# Patient Record
Sex: Female | Born: 1960 | Race: White | Smoking: Current every day smoker
Health system: Southern US, Community
[De-identification: ages and names within clinical notes are randomized; demographics above are authoritative.]

## PROBLEM LIST (undated history)

## (undated) DIAGNOSIS — I10 Essential (primary) hypertension: Secondary | ICD-10-CM

## (undated) DIAGNOSIS — E119 Type 2 diabetes mellitus without complications: Secondary | ICD-10-CM

## (undated) DIAGNOSIS — F32A Depression, unspecified: Secondary | ICD-10-CM

## (undated) HISTORY — PX: ABDOMINAL HYSTERECTOMY: SHX81

---

## 2020-05-24 ENCOUNTER — Other Ambulatory Visit: Payer: Self-pay | Admitting: Sports Medicine

## 2020-05-24 ENCOUNTER — Other Ambulatory Visit: Payer: Self-pay | Admitting: Internal Medicine

## 2020-05-24 DIAGNOSIS — M79671 Pain in right foot: Secondary | ICD-10-CM

## 2020-05-24 DIAGNOSIS — S86219A Strain of muscle(s) and tendon(s) of anterior muscle group at lower leg level, unspecified leg, initial encounter: Secondary | ICD-10-CM

## 2020-05-25 ENCOUNTER — Other Ambulatory Visit: Payer: Self-pay

## 2020-05-25 ENCOUNTER — Ambulatory Visit
Admission: RE | Admit: 2020-05-25 | Discharge: 2020-05-25 | Disposition: A | Discharge status: Home / Self Care | Payer: BC Managed Care – PPO | Source: Ambulatory Visit | Attending: Sports Medicine | Admitting: Sports Medicine

## 2020-05-25 DIAGNOSIS — M79671 Pain in right foot: Secondary | ICD-10-CM

## 2020-05-25 DIAGNOSIS — S86219A Strain of muscle(s) and tendon(s) of anterior muscle group at lower leg level, unspecified leg, initial encounter: Secondary | ICD-10-CM

## 2020-07-25 ENCOUNTER — Other Ambulatory Visit: Payer: Self-pay

## 2020-07-25 ENCOUNTER — Inpatient Hospital Stay (HOSPITAL_COMMUNITY): Payer: BC Managed Care – PPO

## 2020-07-25 ENCOUNTER — Inpatient Hospital Stay (HOSPITAL_COMMUNITY)
Admission: EM | Admit: 2020-07-25 | Discharge: 2020-07-29 | DRG: 522 | Disposition: A | Discharge status: Home Health | Payer: BC Managed Care – PPO | Attending: Internal Medicine | Admitting: Internal Medicine

## 2020-07-25 ENCOUNTER — Encounter (HOSPITAL_COMMUNITY)
Admission: EM | Disposition: A | Discharge status: Home Health | Payer: Self-pay | Source: Home / Self Care | Attending: Internal Medicine

## 2020-07-25 ENCOUNTER — Emergency Department (HOSPITAL_COMMUNITY): Payer: BC Managed Care – PPO

## 2020-07-25 ENCOUNTER — Inpatient Hospital Stay (HOSPITAL_COMMUNITY): Payer: BC Managed Care – PPO | Admitting: Anesthesiology

## 2020-07-25 ENCOUNTER — Encounter (HOSPITAL_COMMUNITY): Payer: Self-pay | Admitting: *Deleted

## 2020-07-25 DIAGNOSIS — D473 Essential (hemorrhagic) thrombocythemia: Secondary | ICD-10-CM

## 2020-07-25 DIAGNOSIS — D62 Acute posthemorrhagic anemia: Secondary | ICD-10-CM | POA: Diagnosis not present

## 2020-07-25 DIAGNOSIS — Z20822 Contact with and (suspected) exposure to covid-19: Secondary | ICD-10-CM | POA: Diagnosis present

## 2020-07-25 DIAGNOSIS — E1169 Type 2 diabetes mellitus with other specified complication: Secondary | ICD-10-CM | POA: Diagnosis present

## 2020-07-25 DIAGNOSIS — E785 Hyperlipidemia, unspecified: Secondary | ICD-10-CM | POA: Diagnosis present

## 2020-07-25 DIAGNOSIS — D75839 Thrombocytosis, unspecified: Secondary | ICD-10-CM | POA: Diagnosis present

## 2020-07-25 DIAGNOSIS — Y92009 Unspecified place in unspecified non-institutional (private) residence as the place of occurrence of the external cause: Secondary | ICD-10-CM

## 2020-07-25 DIAGNOSIS — F329 Major depressive disorder, single episode, unspecified: Secondary | ICD-10-CM | POA: Diagnosis present

## 2020-07-25 DIAGNOSIS — S72051A Unspecified fracture of head of right femur, initial encounter for closed fracture: Secondary | ICD-10-CM | POA: Diagnosis not present

## 2020-07-25 DIAGNOSIS — R7989 Other specified abnormal findings of blood chemistry: Secondary | ICD-10-CM | POA: Diagnosis present

## 2020-07-25 DIAGNOSIS — E871 Hypo-osmolality and hyponatremia: Secondary | ICD-10-CM | POA: Diagnosis present

## 2020-07-25 DIAGNOSIS — Z96641 Presence of right artificial hip joint: Secondary | ICD-10-CM

## 2020-07-25 DIAGNOSIS — D561 Beta thalassemia: Secondary | ICD-10-CM | POA: Diagnosis present

## 2020-07-25 DIAGNOSIS — S72091A Other fracture of head and neck of right femur, initial encounter for closed fracture: Secondary | ICD-10-CM | POA: Diagnosis present

## 2020-07-25 DIAGNOSIS — D638 Anemia in other chronic diseases classified elsewhere: Secondary | ICD-10-CM | POA: Diagnosis present

## 2020-07-25 DIAGNOSIS — Z90710 Acquired absence of both cervix and uterus: Secondary | ICD-10-CM | POA: Diagnosis not present

## 2020-07-25 DIAGNOSIS — S7290XA Unspecified fracture of unspecified femur, initial encounter for closed fracture: Secondary | ICD-10-CM | POA: Diagnosis present

## 2020-07-25 DIAGNOSIS — N179 Acute kidney failure, unspecified: Secondary | ICD-10-CM | POA: Diagnosis present

## 2020-07-25 DIAGNOSIS — Z6827 Body mass index (BMI) 27.0-27.9, adult: Secondary | ICD-10-CM

## 2020-07-25 DIAGNOSIS — Z888 Allergy status to other drugs, medicaments and biological substances status: Secondary | ICD-10-CM | POA: Diagnosis not present

## 2020-07-25 DIAGNOSIS — E669 Obesity, unspecified: Secondary | ICD-10-CM | POA: Diagnosis present

## 2020-07-25 DIAGNOSIS — S72001A Fracture of unspecified part of neck of right femur, initial encounter for closed fracture: Secondary | ICD-10-CM | POA: Diagnosis present

## 2020-07-25 DIAGNOSIS — D72829 Elevated white blood cell count, unspecified: Secondary | ICD-10-CM | POA: Diagnosis present

## 2020-07-25 DIAGNOSIS — F32A Depression, unspecified: Secondary | ICD-10-CM | POA: Diagnosis present

## 2020-07-25 DIAGNOSIS — R451 Restlessness and agitation: Secondary | ICD-10-CM | POA: Diagnosis not present

## 2020-07-25 DIAGNOSIS — Z79899 Other long term (current) drug therapy: Secondary | ICD-10-CM

## 2020-07-25 DIAGNOSIS — Z7984 Long term (current) use of oral hypoglycemic drugs: Secondary | ICD-10-CM

## 2020-07-25 DIAGNOSIS — Z419 Encounter for procedure for purposes other than remedying health state, unspecified: Secondary | ICD-10-CM

## 2020-07-25 DIAGNOSIS — I10 Essential (primary) hypertension: Secondary | ICD-10-CM | POA: Diagnosis present

## 2020-07-25 DIAGNOSIS — S728X1A Other fracture of right femur, initial encounter for closed fracture: Secondary | ICD-10-CM | POA: Diagnosis not present

## 2020-07-25 DIAGNOSIS — Z880 Allergy status to penicillin: Secondary | ICD-10-CM | POA: Diagnosis not present

## 2020-07-25 DIAGNOSIS — F17210 Nicotine dependence, cigarettes, uncomplicated: Secondary | ICD-10-CM | POA: Diagnosis present

## 2020-07-25 DIAGNOSIS — K219 Gastro-esophageal reflux disease without esophagitis: Secondary | ICD-10-CM | POA: Diagnosis present

## 2020-07-25 DIAGNOSIS — W010XXA Fall on same level from slipping, tripping and stumbling without subsequent striking against object, initial encounter: Secondary | ICD-10-CM | POA: Diagnosis present

## 2020-07-25 DIAGNOSIS — W19XXXA Unspecified fall, initial encounter: Secondary | ICD-10-CM | POA: Diagnosis present

## 2020-07-25 DIAGNOSIS — D509 Iron deficiency anemia, unspecified: Secondary | ICD-10-CM | POA: Diagnosis present

## 2020-07-25 HISTORY — PX: TOTAL HIP ARTHROPLASTY: SHX124

## 2020-07-25 HISTORY — DX: Depression, unspecified: F32.A

## 2020-07-25 HISTORY — DX: Type 2 diabetes mellitus without complications: E11.9

## 2020-07-25 HISTORY — DX: Essential (primary) hypertension: I10

## 2020-07-25 LAB — CBG MONITORING, ED
Glucose-Capillary: 145 mg/dL — ABNORMAL HIGH (ref 70–99)
Glucose-Capillary: 131 mg/dL — ABNORMAL HIGH (ref 70–99)

## 2020-07-25 LAB — URINALYSIS, ROUTINE W REFLEX MICROSCOPIC
Bilirubin Urine: NEGATIVE
Glucose, UA: NEGATIVE mg/dL
Hgb urine dipstick: NEGATIVE
Ketones, ur: NEGATIVE mg/dL
Leukocytes,Ua: NEGATIVE
Nitrite: NEGATIVE
Protein, ur: NEGATIVE mg/dL
Specific Gravity, Urine: 1.023 (ref 1.005–1.030)
pH: 5 (ref 5.0–8.0)

## 2020-07-25 LAB — ABO/RH: ABO/RH(D): O POS

## 2020-07-25 LAB — POCT I-STAT, CHEM 8
BUN: 21 mg/dL — ABNORMAL HIGH (ref 6–20)
Calcium, Ion: 1.21 mmol/L (ref 1.15–1.40)
Chloride: 98 mmol/L (ref 98–111)
Creatinine, Ser: 1 mg/dL (ref 0.44–1.00)
Glucose, Bld: 130 mg/dL — ABNORMAL HIGH (ref 70–99)
HCT: 23 % — ABNORMAL LOW (ref 36.0–46.0)
Hemoglobin: 7.8 g/dL — ABNORMAL LOW (ref 12.0–15.0)
Potassium: 4.3 mmol/L (ref 3.5–5.1)
Sodium: 135 mmol/L (ref 135–145)
TCO2: 22 mmol/L (ref 22–32)

## 2020-07-25 LAB — CBC
HCT: 34.4 % — ABNORMAL LOW (ref 36.0–46.0)
Hemoglobin: 9.6 g/dL — ABNORMAL LOW (ref 12.0–15.0)
MCH: 16.8 pg — ABNORMAL LOW (ref 26.0–34.0)
MCHC: 27.9 g/dL — ABNORMAL LOW (ref 30.0–36.0)
MCV: 60.1 fL — ABNORMAL LOW (ref 80.0–100.0)
Platelets: 401 10*3/uL — ABNORMAL HIGH (ref 150–400)
RBC: 5.72 MIL/uL — ABNORMAL HIGH (ref 3.87–5.11)
RDW: 18.7 % — ABNORMAL HIGH (ref 11.5–15.5)
WBC: 14.9 10*3/uL — ABNORMAL HIGH (ref 4.0–10.5)
nRBC: 0 % (ref 0.0–0.2)

## 2020-07-25 LAB — BASIC METABOLIC PANEL
Anion gap: 15 (ref 5–15)
BUN: 20 mg/dL (ref 6–20)
CO2: 21 mmol/L — ABNORMAL LOW (ref 22–32)
Calcium: 10.1 mg/dL (ref 8.9–10.3)
Chloride: 95 mmol/L — ABNORMAL LOW (ref 98–111)
Creatinine, Ser: 1.25 mg/dL — ABNORMAL HIGH (ref 0.44–1.00)
GFR calc Af Amer: 55 mL/min — ABNORMAL LOW (ref >60)
GFR calc non Af Amer: 47 mL/min — ABNORMAL LOW (ref >60)
Glucose, Bld: 162 mg/dL — ABNORMAL HIGH (ref 70–99)
Potassium: 3.9 mmol/L (ref 3.5–5.1)
Sodium: 131 mmol/L — ABNORMAL LOW (ref 135–145)

## 2020-07-25 LAB — GLUCOSE, CAPILLARY
Glucose-Capillary: 131 mg/dL — ABNORMAL HIGH (ref 70–99)
Glucose-Capillary: 201 mg/dL — ABNORMAL HIGH (ref 70–99)

## 2020-07-25 LAB — HEMOGLOBIN A1C
Hgb A1c MFr Bld: 7 % — ABNORMAL HIGH (ref 4.8–5.6)
Mean Plasma Glucose: 154.2 mg/dL

## 2020-07-25 LAB — HIV ANTIBODY (ROUTINE TESTING W REFLEX): HIV Screen 4th Generation wRfx: NONREACTIVE

## 2020-07-25 LAB — CK: Total CK: 132 U/L (ref 38–234)

## 2020-07-25 LAB — SARS CORONAVIRUS 2 BY RT PCR (HOSPITAL ORDER, PERFORMED IN ~~LOC~~ HOSPITAL LAB): SARS Coronavirus 2: NEGATIVE

## 2020-07-25 SURGERY — ARTHROPLASTY, HIP, TOTAL,POSTERIOR APPROACH
Anesthesia: General | Site: Hip | Laterality: Right

## 2020-07-25 MED ORDER — ROCURONIUM BROMIDE 10 MG/ML (PF) SYRINGE
PREFILLED_SYRINGE | INTRAVENOUS | Status: DC | PRN
  Administered 2020-07-25: 50 mg via INTRAVENOUS

## 2020-07-25 MED ORDER — FENTANYL CITRATE (PF) 100 MCG/2ML IJ SOLN
INTRAMUSCULAR | Status: AC
  Filled 2020-07-25: qty 2

## 2020-07-25 MED ORDER — SENNA 8.6 MG PO TABS
1.0000 | ORAL_TABLET | Freq: Every evening | ORAL | Status: DC
  Administered 2020-07-26 – 2020-07-29 (×4): 8.6 mg via ORAL
  Filled 2020-07-25 (×4): qty 1

## 2020-07-25 MED ORDER — SODIUM CHLORIDE 0.9% IV SOLUTION
Freq: Once | INTRAVENOUS | Status: AC
  Administered 2020-07-25: 200 mL/h via INTRAVENOUS

## 2020-07-25 MED ORDER — ACETAMINOPHEN 325 MG PO TABS
325.0000 mg | ORAL_TABLET | Freq: Four times a day (QID) | ORAL | Status: DC | PRN
  Administered 2020-07-26 – 2020-07-27 (×2): 325 mg via ORAL
  Administered 2020-07-28 (×2): 650 mg via ORAL
  Filled 2020-07-25: qty 1
  Filled 2020-07-25: qty 2
  Filled 2020-07-25: qty 1
  Filled 2020-07-25: qty 2

## 2020-07-25 MED ORDER — PROPOFOL 10 MG/ML IV BOLUS
INTRAVENOUS | Status: DC | PRN
  Administered 2020-07-25: 120 mg via INTRAVENOUS

## 2020-07-25 MED ORDER — TRANEXAMIC ACID-NACL 1000-0.7 MG/100ML-% IV SOLN
INTRAVENOUS | Status: AC | PRN
  Administered 2020-07-25: 1000 mg via INTRAVENOUS

## 2020-07-25 MED ORDER — SODIUM CHLORIDE 0.9 % IV SOLN: Freq: Once | INTRAVENOUS | Status: AC

## 2020-07-25 MED ORDER — ROSUVASTATIN CALCIUM 5 MG PO TABS
10.0000 mg | ORAL_TABLET | Freq: Every day | ORAL | Status: DC
  Administered 2020-07-25 – 2020-07-29 (×5): 10 mg via ORAL
  Filled 2020-07-25 (×5): qty 2

## 2020-07-25 MED ORDER — CHLORHEXIDINE GLUCONATE 0.12 % MT SOLN
OROMUCOSAL | Status: AC
  Filled 2020-07-25: qty 15

## 2020-07-25 MED ORDER — POVIDONE-IODINE 10 % EX SWAB: 2.0000 "application " | Freq: Once | CUTANEOUS | Status: DC

## 2020-07-25 MED ORDER — PANTOPRAZOLE SODIUM 40 MG PO TBEC
40.0000 mg | DELAYED_RELEASE_TABLET | Freq: Every day | ORAL | Status: DC
  Administered 2020-07-25: 40 mg via ORAL
  Filled 2020-07-25: qty 1

## 2020-07-25 MED ORDER — MEPERIDINE HCL 25 MG/ML IJ SOLN: 6.2500 mg | INTRAMUSCULAR | Status: DC | PRN

## 2020-07-25 MED ORDER — OXYCODONE HCL 5 MG PO TABS: 5.0000 mg | ORAL_TABLET | Freq: Once | ORAL | Status: DC | PRN

## 2020-07-25 MED ORDER — CHLORHEXIDINE GLUCONATE 4 % EX LIQD: 60.0000 mL | Freq: Once | CUTANEOUS | Status: DC

## 2020-07-25 MED ORDER — BISACODYL 5 MG PO TBEC: 5.0000 mg | DELAYED_RELEASE_TABLET | Freq: Every day | ORAL | Status: DC | PRN

## 2020-07-25 MED ORDER — OXYCODONE HCL 5 MG/5ML PO SOLN: 5.0000 mg | Freq: Once | ORAL | Status: DC | PRN

## 2020-07-25 MED ORDER — BUPIVACAINE-EPINEPHRINE 0.5% -1:200000 IJ SOLN
INTRAMUSCULAR | Status: DC | PRN
  Administered 2020-07-25: 30 mL

## 2020-07-25 MED ORDER — ALUM & MAG HYDROXIDE-SIMETH 200-200-20 MG/5ML PO SUSP: 30.0000 mL | ORAL | Status: DC | PRN

## 2020-07-25 MED ORDER — CEFAZOLIN SODIUM-DEXTROSE 2-4 GM/100ML-% IV SOLN: 2.0000 g | INTRAVENOUS | Status: DC

## 2020-07-25 MED ORDER — TRANEXAMIC ACID-NACL 1000-0.7 MG/100ML-% IV SOLN
INTRAVENOUS | Status: AC
  Filled 2020-07-25: qty 100

## 2020-07-25 MED ORDER — DEXAMETHASONE SODIUM PHOSPHATE 10 MG/ML IJ SOLN
INTRAMUSCULAR | Status: AC
  Filled 2020-07-25: qty 1

## 2020-07-25 MED ORDER — FERROUS SULFATE 325 (65 FE) MG PO TABS
325.0000 mg | ORAL_TABLET | Freq: Two times a day (BID) | ORAL | Status: DC
  Administered 2020-07-26 – 2020-07-29 (×8): 325 mg via ORAL
  Filled 2020-07-25 (×8): qty 1

## 2020-07-25 MED ORDER — ALBUMIN HUMAN 5 % IV SOLN: INTRAVENOUS | Status: DC | PRN

## 2020-07-25 MED ORDER — CLINDAMYCIN PHOSPHATE 900 MG/50ML IV SOLN
INTRAVENOUS | Status: AC
  Filled 2020-07-25: qty 50

## 2020-07-25 MED ORDER — BUPIVACAINE HCL (PF) 0.5 % IJ SOLN
INTRAMUSCULAR | Status: AC
  Filled 2020-07-25: qty 30

## 2020-07-25 MED ORDER — HYDROMORPHONE HCL 1 MG/ML IJ SOLN
1.0000 mg | Freq: Once | INTRAMUSCULAR | Status: AC
  Administered 2020-07-25: 1 mg via INTRAVENOUS
  Filled 2020-07-25: qty 1

## 2020-07-25 MED ORDER — BUPIVACAINE-EPINEPHRINE 0.5% -1:200000 IJ SOLN
INTRAMUSCULAR | Status: AC
  Filled 2020-07-25: qty 1

## 2020-07-25 MED ORDER — POLYETHYLENE GLYCOL 3350 17 G PO PACK: 17.0000 g | PACK | Freq: Every day | ORAL | Status: DC | PRN

## 2020-07-25 MED ORDER — FENTANYL CITRATE (PF) 250 MCG/5ML IJ SOLN
INTRAMUSCULAR | Status: AC
  Filled 2020-07-25: qty 5

## 2020-07-25 MED ORDER — OXYCODONE HCL 5 MG PO TABS
5.0000 mg | ORAL_TABLET | ORAL | Status: DC | PRN
  Administered 2020-07-25: 5 mg via ORAL
  Filled 2020-07-25 (×2): qty 1

## 2020-07-25 MED ORDER — ONDANSETRON HCL 4 MG/2ML IJ SOLN
INTRAMUSCULAR | Status: DC | PRN
  Administered 2020-07-25: 4 mg via INTRAVENOUS

## 2020-07-25 MED ORDER — DEXTROSE-NACL 5-0.45 % IV SOLN: INTRAVENOUS | Status: DC

## 2020-07-25 MED ORDER — DOCUSATE SODIUM 100 MG PO CAPS
100.0000 mg | ORAL_CAPSULE | Freq: Two times a day (BID) | ORAL | Status: DC
  Administered 2020-07-25 – 2020-07-29 (×8): 100 mg via ORAL
  Filled 2020-07-25 (×8): qty 1

## 2020-07-25 MED ORDER — INSULIN ASPART 100 UNIT/ML ~~LOC~~ SOLN: 0.0000 [IU] | Freq: Every day | SUBCUTANEOUS | Status: DC

## 2020-07-25 MED ORDER — ENOXAPARIN SODIUM 40 MG/0.4ML ~~LOC~~ SOLN
40.0000 mg | SUBCUTANEOUS | Status: DC
  Administered 2020-07-25: 40 mg via SUBCUTANEOUS
  Filled 2020-07-25 (×2): qty 0.4

## 2020-07-25 MED ORDER — MIDAZOLAM HCL 2 MG/2ML IJ SOLN
INTRAMUSCULAR | Status: AC
  Filled 2020-07-25: qty 2

## 2020-07-25 MED ORDER — METOCLOPRAMIDE HCL 5 MG/ML IJ SOLN: 5.0000 mg | Freq: Three times a day (TID) | INTRAMUSCULAR | Status: DC | PRN

## 2020-07-25 MED ORDER — INSULIN ASPART 100 UNIT/ML ~~LOC~~ SOLN
0.0000 [IU] | Freq: Three times a day (TID) | SUBCUTANEOUS | Status: DC
  Administered 2020-07-26: 2 [IU] via SUBCUTANEOUS
  Administered 2020-07-26: 1 [IU] via SUBCUTANEOUS
  Administered 2020-07-26: 2 [IU] via SUBCUTANEOUS
  Administered 2020-07-27: 1 [IU] via SUBCUTANEOUS
  Administered 2020-07-27 (×2): 2 [IU] via SUBCUTANEOUS
  Administered 2020-07-28: 1 [IU] via SUBCUTANEOUS

## 2020-07-25 MED ORDER — TRANEXAMIC ACID-NACL 1000-0.7 MG/100ML-% IV SOLN
1000.0000 mg | INTRAVENOUS | Status: AC
  Administered 2020-07-25: 1000 mg via INTRAVENOUS

## 2020-07-25 MED ORDER — HYDROMORPHONE HCL 1 MG/ML IJ SOLN
0.2500 mg | INTRAMUSCULAR | Status: DC | PRN
  Administered 2020-07-25 (×2): 0.5 mg via INTRAVENOUS

## 2020-07-25 MED ORDER — OXYCODONE HCL 5 MG PO TABS
10.0000 mg | ORAL_TABLET | ORAL | Status: DC | PRN
  Administered 2020-07-26 – 2020-07-27 (×3): 15 mg via ORAL
  Administered 2020-07-27 – 2020-07-28 (×2): 10 mg via ORAL
  Filled 2020-07-25 (×3): qty 3

## 2020-07-25 MED ORDER — INSULIN ASPART 100 UNIT/ML ~~LOC~~ SOLN
6.0000 [IU] | Freq: Once | SUBCUTANEOUS | Status: AC
  Administered 2020-07-25: 6 [IU] via SUBCUTANEOUS

## 2020-07-25 MED ORDER — METHOCARBAMOL 1000 MG/10ML IJ SOLN
500.0000 mg | Freq: Four times a day (QID) | INTRAVENOUS | Status: DC | PRN
  Filled 2020-07-25: qty 5

## 2020-07-25 MED ORDER — ZOLPIDEM TARTRATE 5 MG PO TABS
5.0000 mg | ORAL_TABLET | Freq: Every day | ORAL | Status: DC
  Administered 2020-07-25 – 2020-07-28 (×4): 5 mg via ORAL
  Filled 2020-07-25 (×4): qty 1

## 2020-07-25 MED ORDER — ONDANSETRON HCL 4 MG/2ML IJ SOLN
4.0000 mg | Freq: Once | INTRAMUSCULAR | Status: AC
  Administered 2020-07-25: 4 mg via INTRAVENOUS
  Filled 2020-07-25: qty 2

## 2020-07-25 MED ORDER — MIDAZOLAM HCL 5 MG/5ML IJ SOLN
INTRAMUSCULAR | Status: DC | PRN
  Administered 2020-07-25: 2 mg via INTRAVENOUS

## 2020-07-25 MED ORDER — BUPROPION HCL ER (XL) 150 MG PO TB24
150.0000 mg | ORAL_TABLET | Freq: Every morning | ORAL | Status: DC
  Administered 2020-07-25 – 2020-07-29 (×5): 150 mg via ORAL
  Filled 2020-07-25 (×5): qty 1

## 2020-07-25 MED ORDER — LIDOCAINE 2% (20 MG/ML) 5 ML SYRINGE
INTRAMUSCULAR | Status: AC
  Filled 2020-07-25: qty 5

## 2020-07-25 MED ORDER — CLINDAMYCIN PHOSPHATE 900 MG/50ML IV SOLN: 900.0000 mg | Freq: Once | INTRAVENOUS | Status: DC

## 2020-07-25 MED ORDER — ONDANSETRON HCL 4 MG PO TABS: 4.0000 mg | ORAL_TABLET | Freq: Four times a day (QID) | ORAL | Status: DC | PRN

## 2020-07-25 MED ORDER — LACTATED RINGERS IV SOLN: INTRAVENOUS | Status: DC | PRN

## 2020-07-25 MED ORDER — VANCOMYCIN HCL IN DEXTROSE 1-5 GM/200ML-% IV SOLN
1000.0000 mg | Freq: Once | INTRAVENOUS | Status: AC
  Administered 2020-07-25: 1000 mg via INTRAVENOUS
  Filled 2020-07-25: qty 200

## 2020-07-25 MED ORDER — BUPIVACAINE LIPOSOME 1.3 % IJ SUSP
20.0000 mL | Freq: Once | INTRAMUSCULAR | Status: AC
  Administered 2020-07-25: 20 mL
  Filled 2020-07-25: qty 20

## 2020-07-25 MED ORDER — TRANEXAMIC ACID-NACL 1000-0.7 MG/100ML-% IV SOLN
1000.0000 mg | Freq: Once | INTRAVENOUS | Status: AC
  Administered 2020-07-25: 1000 mg via INTRAVENOUS
  Filled 2020-07-25: qty 100

## 2020-07-25 MED ORDER — PROMETHAZINE HCL 25 MG/ML IJ SOLN: 6.2500 mg | INTRAMUSCULAR | Status: DC | PRN

## 2020-07-25 MED ORDER — HYDROMORPHONE HCL 1 MG/ML IJ SOLN
INTRAMUSCULAR | Status: AC
  Administered 2020-07-27: 1 mg via INTRAVENOUS
  Filled 2020-07-25: qty 1

## 2020-07-25 MED ORDER — CHLORHEXIDINE GLUCONATE 0.12 % MT SOLN: 15.0000 mL | Freq: Once | OROMUCOSAL | Status: DC

## 2020-07-25 MED ORDER — SUGAMMADEX SODIUM 200 MG/2ML IV SOLN
INTRAVENOUS | Status: DC | PRN
  Administered 2020-07-25: 200 mg via INTRAVENOUS

## 2020-07-25 MED ORDER — DULOXETINE HCL 60 MG PO CPEP
60.0000 mg | ORAL_CAPSULE | Freq: Every day | ORAL | Status: DC
  Administered 2020-07-25 – 2020-07-29 (×5): 60 mg via ORAL
  Filled 2020-07-25 (×5): qty 1

## 2020-07-25 MED ORDER — CEFAZOLIN SODIUM-DEXTROSE 2-4 GM/100ML-% IV SOLN
INTRAVENOUS | Status: AC
  Filled 2020-07-25: qty 100

## 2020-07-25 MED ORDER — METOCLOPRAMIDE HCL 5 MG PO TABS: 5.0000 mg | ORAL_TABLET | Freq: Three times a day (TID) | ORAL | Status: DC | PRN

## 2020-07-25 MED ORDER — LIDOCAINE 2% (20 MG/ML) 5 ML SYRINGE
INTRAMUSCULAR | Status: DC | PRN
  Administered 2020-07-25: 40 mg via INTRAVENOUS

## 2020-07-25 MED ORDER — NICOTINE 14 MG/24HR TD PT24
14.0000 mg | MEDICATED_PATCH | Freq: Every day | TRANSDERMAL | Status: DC
  Administered 2020-07-25 – 2020-07-29 (×4): 14 mg via TRANSDERMAL
  Filled 2020-07-25 (×5): qty 1

## 2020-07-25 MED ORDER — HYDROMORPHONE HCL 1 MG/ML IJ SOLN
0.5000 mg | INTRAMUSCULAR | Status: DC | PRN
  Administered 2020-07-25 (×3): 0.5 mg via INTRAVENOUS
  Filled 2020-07-25 (×3): qty 1

## 2020-07-25 MED ORDER — METHOCARBAMOL 500 MG PO TABS
500.0000 mg | ORAL_TABLET | Freq: Four times a day (QID) | ORAL | Status: DC | PRN
  Administered 2020-07-26 – 2020-07-29 (×9): 500 mg via ORAL
  Filled 2020-07-25 (×9): qty 1

## 2020-07-25 MED ORDER — ROCURONIUM BROMIDE 10 MG/ML (PF) SYRINGE
PREFILLED_SYRINGE | INTRAVENOUS | Status: AC
  Filled 2020-07-25: qty 10

## 2020-07-25 MED ORDER — OXYCODONE HCL 5 MG PO TABS: 5.0000 mg | ORAL_TABLET | ORAL | Status: DC | PRN

## 2020-07-25 MED ORDER — DEXAMETHASONE SODIUM PHOSPHATE 10 MG/ML IJ SOLN
INTRAMUSCULAR | Status: DC | PRN
  Administered 2020-07-25: 10 mg via INTRAVENOUS

## 2020-07-25 MED ORDER — MIDAZOLAM HCL 2 MG/2ML IJ SOLN: 0.5000 mg | Freq: Once | INTRAMUSCULAR | Status: DC | PRN

## 2020-07-25 MED ORDER — 0.9 % SODIUM CHLORIDE (POUR BTL) OPTIME
TOPICAL | Status: DC | PRN
  Administered 2020-07-25: 1000 mL

## 2020-07-25 MED ORDER — HYDROMORPHONE HCL 1 MG/ML IJ SOLN
0.5000 mg | INTRAMUSCULAR | Status: DC | PRN
  Administered 2020-07-25 – 2020-07-27 (×6): 1 mg via INTRAVENOUS
  Filled 2020-07-25 (×6): qty 1

## 2020-07-25 MED ORDER — PANTOPRAZOLE SODIUM 40 MG PO TBEC
40.0000 mg | DELAYED_RELEASE_TABLET | Freq: Every day | ORAL | Status: DC
  Administered 2020-07-26 – 2020-07-29 (×4): 40 mg via ORAL
  Filled 2020-07-25 (×4): qty 1

## 2020-07-25 MED ORDER — HYDROMORPHONE HCL 1 MG/ML IJ SOLN
1.0000 mg | INTRAMUSCULAR | Status: DC | PRN
  Filled 2020-07-25: qty 1

## 2020-07-25 MED ORDER — FENTANYL CITRATE (PF) 100 MCG/2ML IJ SOLN
INTRAMUSCULAR | Status: DC | PRN
Start: 2020-07-25 — End: 2020-07-25
  Administered 2020-07-25 (×2): 50 ug via INTRAVENOUS
  Administered 2020-07-25: 150 ug via INTRAVENOUS

## 2020-07-25 MED ORDER — MAGNESIUM CITRATE PO SOLN: 1.0000 | Freq: Once | ORAL | Status: DC | PRN

## 2020-07-25 MED ORDER — PHENYLEPHRINE HCL (PRESSORS) 10 MG/ML IV SOLN
INTRAVENOUS | Status: DC | PRN
  Administered 2020-07-25 (×2): 80 ug via INTRAVENOUS
  Administered 2020-07-25 (×4): 120 ug via INTRAVENOUS
  Administered 2020-07-25 (×2): 80 ug via INTRAVENOUS
  Administered 2020-07-25 (×2): 120 ug via INTRAVENOUS
  Administered 2020-07-25: 40 ug via INTRAVENOUS

## 2020-07-25 MED ORDER — PROPOFOL 10 MG/ML IV BOLUS
INTRAVENOUS | Status: AC
  Filled 2020-07-25: qty 20

## 2020-07-25 MED ORDER — HYDRALAZINE HCL 20 MG/ML IJ SOLN: 10.0000 mg | INTRAMUSCULAR | Status: DC | PRN

## 2020-07-25 MED ORDER — INSULIN ASPART 100 UNIT/ML ~~LOC~~ SOLN
SUBCUTANEOUS | Status: AC
  Filled 2020-07-25: qty 1

## 2020-07-25 MED ORDER — ONDANSETRON HCL 4 MG/2ML IJ SOLN: 4.0000 mg | Freq: Four times a day (QID) | INTRAMUSCULAR | Status: DC | PRN

## 2020-07-25 MED ORDER — OXYCODONE HCL 5 MG PO TABS
5.0000 mg | ORAL_TABLET | ORAL | Status: DC | PRN
  Administered 2020-07-26 (×2): 10 mg via ORAL
  Administered 2020-07-26: 5 mg via ORAL
  Administered 2020-07-27 – 2020-07-29 (×2): 10 mg via ORAL
  Filled 2020-07-25 (×7): qty 2

## 2020-07-25 MED ORDER — ONDANSETRON HCL 4 MG/2ML IJ SOLN
INTRAMUSCULAR | Status: AC
  Filled 2020-07-25: qty 2

## 2020-07-25 MED ORDER — PHENYLEPHRINE 40 MCG/ML (10ML) SYRINGE FOR IV PUSH (FOR BLOOD PRESSURE SUPPORT)
PREFILLED_SYRINGE | INTRAVENOUS | Status: AC
  Filled 2020-07-25: qty 30

## 2020-07-25 SURGICAL SUPPLY — 67 items
BENZOIN TINCTURE PRP APPL 2/3 (GAUZE/BANDAGES/DRESSINGS) ×3 IMPLANT
BLADE SAW SAG 73X25 THK (BLADE) ×2
BLADE SAW SGTL 73X25 THK (BLADE) ×1 IMPLANT
BRUSH FEMORAL CANAL (MISCELLANEOUS) IMPLANT
CLOSURE WOUND 1/2 X4 (GAUZE/BANDAGES/DRESSINGS) ×1
COVER BACK TABLE 24X17X13 BIG (DRAPES) IMPLANT
COVER SURGICAL LIGHT HANDLE (MISCELLANEOUS) ×6 IMPLANT
COVER WAND RF STERILE (DRAPES) IMPLANT
DRAPE HALF SHEET 40X57 (DRAPES) ×3 IMPLANT
DRAPE IMP U-DRAPE 54X76 (DRAPES) ×3 IMPLANT
DRAPE ORTHO SPLIT 77X108 STRL (DRAPES)
DRAPE SURG ORHT 6 SPLT 77X108 (DRAPES) IMPLANT
DRAPE U-SHAPE 47X51 STRL (DRAPES) ×3 IMPLANT
DRSG AQUACEL AG ADV 3.5X 4 (GAUZE/BANDAGES/DRESSINGS) ×3 IMPLANT
DRSG AQUACEL AG ADV 3.5X 6 (GAUZE/BANDAGES/DRESSINGS) ×3 IMPLANT
DRSG MEPILEX BORDER 4X12 (GAUZE/BANDAGES/DRESSINGS) IMPLANT
DURAPREP 26ML APPLICATOR (WOUND CARE) ×3 IMPLANT
ELECT BLADE 6.5 EXT (BLADE) IMPLANT
ELECT CAUTERY BLADE 6.4 (BLADE) ×3 IMPLANT
ELECT REM PT RETURN 9FT ADLT (ELECTROSURGICAL) ×3
ELECTRODE REM PT RTRN 9FT ADLT (ELECTROSURGICAL) ×1 IMPLANT
ELIMINATOR HOLE APEX DEPUY (Hips) ×3 IMPLANT
EVACUATOR 1/8 PVC DRAIN (DRAIN) IMPLANT
GLOVE BIOGEL PI IND STRL 8 (GLOVE) ×2 IMPLANT
GLOVE BIOGEL PI INDICATOR 8 (GLOVE) ×4
GLOVE ECLIPSE 7.5 STRL STRAW (GLOVE) ×6 IMPLANT
GOWN STRL REUS W/ TWL LRG LVL3 (GOWN DISPOSABLE) ×1 IMPLANT
GOWN STRL REUS W/ TWL XL LVL3 (GOWN DISPOSABLE) ×2 IMPLANT
GOWN STRL REUS W/TWL LRG LVL3 (GOWN DISPOSABLE) ×2
GOWN STRL REUS W/TWL XL LVL3 (GOWN DISPOSABLE) ×4
HEAD FEMORAL 32 CERAMIC (Hips) ×3 IMPLANT
HOOD PEEL AWAY FACE SHEILD DIS (HOOD) ×9 IMPLANT
IMMOBILIZER KNEE 20 (SOFTGOODS)
IMMOBILIZER KNEE 20 THIGH 36 (SOFTGOODS) IMPLANT
IMMOBILIZER KNEE 22 UNIV (SOFTGOODS) IMPLANT
IMMOBILIZER KNEE 24 THIGH 36 (MISCELLANEOUS) IMPLANT
IMMOBILIZER KNEE 24 UNIV (MISCELLANEOUS)
KIT BASIN OR (CUSTOM PROCEDURE TRAY) ×3 IMPLANT
KIT TURNOVER KIT B (KITS) ×3 IMPLANT
LINER ACETABULAR 32X50 (Liner) ×3 IMPLANT
MANIFOLD NEPTUNE II (INSTRUMENTS) ×3 IMPLANT
NEEDLE 1/2 CIR MAYO (NEEDLE) ×3 IMPLANT
NEEDLE 22X1 1/2 (OR ONLY) (NEEDLE) ×3 IMPLANT
NS IRRIG 1000ML POUR BTL (IV SOLUTION) ×3 IMPLANT
PACK TOTAL JOINT (CUSTOM PROCEDURE TRAY) ×3 IMPLANT
PACK UNIVERSAL I (CUSTOM PROCEDURE TRAY) ×3 IMPLANT
PAD ARMBOARD 7.5X6 YLW CONV (MISCELLANEOUS) ×6 IMPLANT
PASSER SUT SWANSON 36MM LOOP (INSTRUMENTS) IMPLANT
PIN SECT CUP 50MM (Hips) ×3 IMPLANT
PRESSURIZER FEMORAL UNIV (MISCELLANEOUS) IMPLANT
STAPLER VISISTAT 35W (STAPLE) ×3 IMPLANT
STEM CORAIL KA11 (Stem) ×3 IMPLANT
STRIP CLOSURE SKIN 1/2X4 (GAUZE/BANDAGES/DRESSINGS) ×2 IMPLANT
SUCTION FRAZIER HANDLE 10FR (MISCELLANEOUS)
SUCTION TUBE FRAZIER 10FR DISP (MISCELLANEOUS) IMPLANT
SUT ETHIBOND 2 V 37 (SUTURE) ×3 IMPLANT
SUT VIC AB 0 CTXB 36 (SUTURE) ×6 IMPLANT
SUT VIC AB 1 CTX 36 (SUTURE) ×4
SUT VIC AB 1 CTX36XBRD ANBCTR (SUTURE) ×2 IMPLANT
SUT VIC AB 2-0 CTB1 (SUTURE) ×6 IMPLANT
SYR CONTROL 10ML LL (SYRINGE) ×3 IMPLANT
TOWEL GREEN STERILE (TOWEL DISPOSABLE) ×3 IMPLANT
TOWEL GREEN STERILE FF (TOWEL DISPOSABLE) ×3 IMPLANT
TOWER CARTRIDGE SMART MIX (DISPOSABLE) IMPLANT
TRAY CATH 16FR W/PLASTIC CATH (SET/KITS/TRAYS/PACK) IMPLANT
TRAY FOLEY MTR SLVR 16FR STAT (SET/KITS/TRAYS/PACK) IMPLANT
WATER STERILE IRR 1000ML POUR (IV SOLUTION) ×12 IMPLANT

## 2020-07-25 NOTE — H&P (Addendum)
History and Physical    Julie [name] GUY:[id number] DOB: Nov 10, 1961 DOA: 07/25/2020  Referring MD/NP/PA: Jennette Kettle, MD PCP: Jenel Lucks, PA-C  Patient coming from: Home Via EMS  Chief Complaint: Fall  I have personally briefly reviewed patient's old medical records in [place]   HPI: Julie [name] is a 58 y.o. female with medical history significant of hypertension, diabetes mellitus type 2, beta thalassemia, and depression presents after a fall with right leg pain.  She had just undergone surgery by Dr. Lucia Gaskins to fix a torn tendon of her right foot at the end of July.  Since that time she has been in a cast and has been a little off balance as a result.  She had been visiting her son last night around 5 PM when she tripped over one of her grandchildren toys and fell landing onto her right knee.  She reported having severe pain in the right knee radiating up to her hip and groin.  Patient denies hitting her head or any loss of consciousness.  Her son was able to help her up and get her back home.  Over the course the night pain in the right leg became more severe despite taking oxycodone 5 mg.  She was unable to move the leg at all without worsening symptoms.  Denies having any fever, chills, chest pain, cough, shortness of breath, palpitations, nausea, vomiting, diarrhea, or dysuria.  She admits to smoking half pack cigarettes per day on average.  ED Course: Upon admission into the emergency department patient was seen to be afebrile with blood pressure mildly elevated up to 155/90 and mildly tachypneic.   labs were significant for WBC 14.9, hemoglobin 9.6, platelets 401, sodium 131, BUN 20, creatinine 1.25, and glucose 162. 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  COVID-19 screening was negative.  Dr. Berenice Primas of orthopedics was consulted and plan for surgical correction  later today.  Review of Systems  Constitutional: Negative for chills and fever.  HENT: Negative for ear discharge and nosebleeds.   Eyes: Negative for photophobia and pain.  Respiratory: Negative for cough and shortness of breath.   Cardiovascular: Negative for chest pain.  Gastrointestinal: Negative for abdominal pain, nausea and vomiting.  Genitourinary: Negative for dysuria and frequency.  Musculoskeletal: Positive for falls, joint pain and myalgias.  Skin: Negative for itching and rash.  Neurological: Negative for focal weakness and loss of consciousness.  Psychiatric/Behavioral: Positive for substance abuse (Tobacco). Negative for memory loss.    Past Medical History:  Diagnosis Date  . Depression   . Diabetes mellitus without complication ([place])   . Hypertension     Past Surgical History:  Procedure Laterality Date  . ABDOMINAL HYSTERECTOMY    . CESAREAN SECTION       reports that she has been smoking. She has been smoking about 0.50 packs per day. She does not have any smokeless tobacco history on file. She reports that she does not drink alcohol and does not use drugs.  Allergies  Allergen Reactions  . Penicillins   . Robitussin Cold+Flu Daytime [Dm-Phenylephrine-Acetaminophen]     No family history on file.  Prior to Admission medications   Medication Sig Start Date End Date Taking? Authorizing Provider  buPROPion (WELLBUTRIN XL) 150 MG 24 hr tablet Take 150 mg by mouth every morning. 07/20/20  Yes [provider]  DULoxetine (CYMBALTA) 60 MG capsule Take 60  mg by mouth daily. 03/25/19  Yes [provider]  ibuprofen (ADVIL) 200 MG tablet Take 800 mg by mouth every 8 (eight) hours as needed.   Yes [provider]  lisinopril-hydrochlorothiazide (ZESTORETIC) 20-25 MG tablet Take 1 tablet by mouth daily. 06/14/20  Yes [provider]  metFORMIN (GLUCOPHAGE) 500 MG tablet Take 500 mg by mouth 2 (two) times daily. 05/14/20  Yes  [provider]  omeprazole (PRILOSEC) 20 MG capsule Take 20 mg by mouth daily. 05/24/20  Yes [provider]  oxyCODONE (OXY IR/ROXICODONE) 5 MG immediate release tablet Take 5 mg by mouth every 4 (four) hours as needed for severe pain.   Yes [provider]  potassium chloride (KLOR-CON) 10 MEQ tablet Take 10 mEq by mouth daily. 08/29/16  Yes [provider]  rosuvastatin (CRESTOR) 10 MG tablet Take 10 mg by mouth daily. 07/20/20  Yes [provider]  senna (SENOKOT) 8.6 MG TABS tablet Take 1 tablet by mouth every evening.   Yes [provider]  zolpidem (AMBIEN) 10 MG tablet Take 10 mg by mouth at bedtime. 07/20/20  Yes [provider]    Physical Exam:  Constitutional: Middle-aged female currently in no acute distress. Vitals:   07/25/20 0355 07/25/20 0445 07/25/20 0600 07/25/20 0645  BP: (!) 155/90 (!) 145/70 (!) 147/75 132/63  Pulse: 92 91 93 89  Resp: (!) 22 18 15 18   Temp: 97.9 F (36.6 C)     SpO2: 99% 98% 93% 94%  Weight: 77.1 kg     Height: 5\' 6"  (1.676 m)      Eyes: PERRL, lids and conjunctivae normal ENMT: Mucous membranes are moist. Posterior pharynx clear of any exudate or lesions.  Neck: normal, supple, no masses, no thyromegaly Respiratory: clear to auscultation bilaterally, no wheezing, no crackles. Normal respiratory effort. No accessory muscle use.  Cardiovascular: Regular rate and rhythm, no murmurs / rubs / gallops. No extremity edema. 2+ pedal pulses. No carotid bruits.  Abdomen: no tenderness, no masses palpated. No hepatosplenomegaly. Bowel sounds positive.  Musculoskeletal: no clubbing / cyanosis.  Right foot in cast from prior procedure. Skin: no rashes, lesions, ulcers. No induration Neurologic: CN 2-12 grossly intact. Sensation intact, DTR normal. Strength 5/5 in all 4.  Psychiatric: Normal judgment and insight. Alert and oriented x 3. Normal mood.     Labs on Admission: I have personally  reviewed following labs and imaging studies  CBC: Recent Labs  Lab 07/25/20 0450  WBC 14.9*  HGB 9.6*  HCT 34.4*  MCV 60.1*  PLT 119*   Basic Metabolic Panel: Recent Labs  Lab 07/25/20 0450  NA 131*  K 3.9  CL 95*  CO2 21*  GLUCOSE 162*  BUN 20  CREATININE 1.25*  CALCIUM 10.1   GFR: Estimated Creatinine Clearance: 51.4 mL/min (A) (by C-G formula based on SCr of 1.25 mg/dL (H)). Liver Function Tests: No results for input(s): AST, ALT, ALKPHOS, BILITOT, PROT, ALBUMIN in the last 168 hours. No results for input(s): LIPASE, AMYLASE in the last 168 hours. No results for input(s): AMMONIA in the last 168 hours. Coagulation Profile: No results for input(s): INR, PROTIME in the last 168 hours. Cardiac Enzymes: No results for input(s): CKTOTAL, CKMB, CKMBINDEX, TROPONINI in the last 168 hours. BNP (last 3 results) No results for input(s): PROBNP in the last 8760 hours. HbA1C: No results for input(s): HGBA1C in the last 72 hours. CBG: No results for input(s): GLUCAP in the last 168 hours. Lipid Profile: No  results for input(s): CHOL, HDL, LDLCALC, TRIG, CHOLHDL, LDLDIRECT in the last 72 hours. Thyroid Function Tests: No results for input(s): TSH, T4TOTAL, FREET4, T3FREE, THYROIDAB in the last 72 hours. Anemia Panel: No results for input(s): VITAMINB12, FOLATE, FERRITIN, TIBC, IRON, RETICCTPCT in the last 72 hours. Urine analysis: No results found for: COLORURINE, APPEARANCEUR, LABSPEC, PHURINE, GLUCOSEU, HGBUR, BILIRUBINUR, KETONESUR, PROTEINUR, UROBILINOGEN, NITRITE, LEUKOCYTESUR Sepsis Labs: Recent Results (from the past 240 hour(s))  SARS Coronavirus 2 by RT PCR (hospital order, performed in [facility] hospital lab) Nasopharyngeal Nasopharyngeal Swab     Status: None   Collection Time: 07/25/20  5:39 AM   Specimen: Nasopharyngeal Swab  Result Value Ref Range Status   SARS Coronavirus 2 NEGATIVE NEGATIVE Final    Comment: (NOTE) SARS-CoV-2 target nucleic acids are  NOT DETECTED.  The SARS-CoV-2 RNA is generally detectable in upper and lower respiratory specimens during the acute phase of infection. The lowest concentration of SARS-CoV-2 viral copies this assay can detect is 250 copies / mL. A negative result does not preclude SARS-CoV-2 infection and should not be used as the sole basis for treatment or other patient management decisions.  A negative result may occur with improper specimen collection / handling, submission of specimen other than nasopharyngeal swab, presence of viral mutation(s) within the areas targeted by this assay, and inadequate number of viral copies (<250 copies / mL). A negative result must be combined with clinical observations, patient history, and epidemiological information.  Fact Sheet for Patients:   StrictlyIdeas.no  Fact Sheet for Healthcare Providers: BankingDealers.co.za  This test is not yet approved or  cleared by the [place] FDA and has been authorized for detection and/or diagnosis of SARS-CoV-2 by FDA under an Emergency Use Authorization (EUA).  This EUA will remain in effect (meaning this test can be used) for the duration of the COVID-19 declaration under Section 564(b)(1) of the Act, 21 U.S.C. section 360bbb-3(b)(1), unless the authorization is terminated or revoked sooner.  Performed at [facility], [place] [address] [place], [place] [address]      Radiological Exams on Admission: DG Chest 1 View  Result Date: 07/25/2020 CLINICAL DATA:  Fall. EXAM: CHEST  1 VIEW COMPARISON:  06/26/2016.  CT 08/30/2016. FINDINGS: Mediastinum and hilar structures normal. Heart size normal. Low lung volumes. No focal infiltrate. No pleural effusion or pneumothorax. No evidence of acute displaced rib fracture. Thoracic spine scoliosis. 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 IMPRESSION: 1. No acute  cardiopulmonary disease. No evidence of displaced rib fracture or pneumothorax. 2. 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 Electronically Signed   By: Marcello Moores  Register   On: 07/25/2020 05:35   DG Knee Complete 4 Views Right  Result Date: 07/25/2020 CLINICAL DATA:  Right femoral neck fracture. EXAM: RIGHT KNEE - COMPLETE 4+ VIEW COMPARISON:  None. FINDINGS: Bedding and atypical positioning due to ipsilateral fracture. No evidence of fracture, dislocation, or joint effusion. IMPRESSION: Negative. Electronically Signed   By: Monte Fantasia M.D.   On: 07/25/2020 05:33   DG Hip Unilat W or Wo Pelvis 2-3 Views Right  Result Date: 07/25/2020 CLINICAL DATA:  Fall at home EXAM: DG HIP (WITH OR WITHOUT PELVIS) 2-3V RIGHT COMPARISON:  None. FINDINGS: Right femoral neck fracture with varus angulation. Limited rotated assessment of the pelvis without detected fracture. No significant acetabular spurring. IMPRESSION: Right femoral neck fracture with varus angulation. Electronically Signed  By: Monte Fantasia M.D.   On: 07/25/2020 05:32   DG Femur Min 2 Views Right  Result Date: 07/25/2020 CLINICAL DATA:  Fall with hip pain EXAM: RIGHT FEMUR 2 VIEWS COMPARISON:  None. FINDINGS: Right femoral neck fracture with varus angulation. The mid and distal femur are intact and located. No significant acetabular spurring. IMPRESSION: Right femoral neck fracture with varus angulation. Electronically Signed   By: Monte Fantasia M.D.   On: 07/25/2020 05:32    Right femur x-rays: Independently reviewed.  Right femoral neck fracture appreciated  Assessment/Plan Right femoral neck fracture after fall: Acute.  Patient tripped over a toy while at home.  Found to have a right femoral neck fracture with varus angulation.  Orthopedics consulted and Dr. Marland KitchenAdmit to a MedSurg bed  -Hip fracture order set utilized -N.p.o. -Oxycodone/Dilaudid IV as needed moderate to severe  pain respectively -Transitions of care consult  -Appreciate orthopedic consultative services we will follow-up for further recommendation  Acute kidney injury: On admission patient presents with creatinine 1.25 with BUN 20.  Patient's baseline creatinine previously noted to be around 0.9. -Avoid nephrotoxic agents (ibuprofen, lisinopril-hydrochlorothiazide) -Check urinalysis and CK -Normal saline IV fluids at 75 mL/h x 1 L -Recheck kidney function in a.m.  Leukocytosis: Acute.  WBC elevated at 14.9.  Chest x-ray showed no cardiopulmonary abnormalities or signs of any rib fractures.  Suspect related to acute fracture. -Follow-up urinalysis -Recheck CBC in a.m.  Beta thalassemia: Acute on chronic.  Hemoglobin 9.6 on admission with low MCV and MCH.  She makes note that she has a history of beta thalassemia and that is a good -Check CBC  Diabetes mellitus type 2: On admission glucose mildly elevated at 162.  Last hemoglobin A1c was 7.2 on 11/23/2019 per Care Everywhere.  Home medications include Metformin 500 mg twice daily. -Hypoglycemic protocol -Check hemoglobin A1c -Hold Metformin -CBGs before every meal with sensitive SSI -Adjust insulin regimen as needed  Essential hypertension: Initial blood pressure is elevated up to 155/90.  Home blood pressure medications include lisinopril-hydrochlorothiazide 20-25 mg daily.  Blood pressures likely elevated in setting of acute injury. -Hold home blood pressure medications due to AKI -Hydralazine IV as needed for elevated blood pressure  Hyponatremia: Acute on chronic.  Sodium 131 on admission, but last noted to be 134 per records from care everywhere.  Hydrochlorothiazide added symptomatically Cobos.  Thrombocytosis: Acute.  Platelet count 4 1 on admission.  Suspect this is reactive to the acute fracture.. -Continue to monitor  Hyperlipidemia: Last lipid profile revealed total cholesterol 110, triglycerides 86, HDL 42, and LDL 51 on 12/27/2019.   Appears well controlled on Home medications of Crestor 10 mg daily. -Continue Crestor  Depression: Home medications include Cymbalta 60 mg daily and Wellbutrin 150 mg every morning. -Continue current regimen  GERD: Patient on omeprazole 20 mg daily. -Continue pharmacy substitution for Protonix  Abnormality noted of the chest x-ray: 1 cm circular linear calcification in the left upper quadrant was noted that possibly may be a calcified splenic artery aneurysm. -May warrant further work-up in outpatient setting  Tobacco abuse: Patient admits to smoking 1/2 ppd or less on average. -Nicotine patch -Continue counseling on the cessation of tobacco use  DVT prophylaxis: Lovenox scheduled to start at 2200 post procedure Code Status: Full Family Communication: Patient in communication with family on the phone Disposition Plan: Likely to need rehab Consults called: Orthopedic Admission status: Inpatient  Norval Morton MD [facility] Pager [phone]   If 7PM-7AM, please  contact night-coverage [email or web address] Password [facility]  07/25/2020, 7:32 AM

## 2020-07-25 NOTE — Progress Notes (Signed)
Patient of Dr. Lucia Gaskins with displaced R femoral neck being admitted from ED.  Likely benefit from hip arthroplasty.  NPO since last night.  Dr. Lucia Gaskins to see this AM to establish plan. Please keep NPO until plan established.  Micheline Rough, MD Hand & Ortho Surgery

## 2020-07-25 NOTE — Op Note (Signed)
[name], [name] MEDICAL RECORD NW:[id number] ACCOUNT [id number] DATE OF BIRTH:07-24-1961 FACILITY: [facility] LOCATION: [facility] PHYSICIAN:Jamye Balicki L. Lenoard Helbert, MD  OPERATIVE REPORT  DATE OF PROCEDURE:  07/25/2020  PREOPERATIVE DIAGNOSIS:  Femoral neck fracture with displacement, right.  POSTOPERATIVE DIAGNOSIS:  Femoral neck fracture with displacement, right.  PROCEDURE:   1.  Right total hip arthroplasty with a 50 mm Pinnacle cup, size 11 Corail stem, a 32 mm +1.5 delta ceramic hip ball.   2.  Interpretation of multiple intraoperative fluoroscopic images.  SURGEON:  Dorna Leitz, MD  ASSISTANT:  Harmon Dun, PA-C, who was present for the entire case and assisted by retraction of the leg, manipulation of tissues and closing to minimize OR time.  BRIEF HISTORY:  The patient is a 59 year old female with a long history of significant complaints of right hip pain after a fall at home.  X-rays in the emergency room showed a displaced femoral neck fracture and I was consulted for treatment of the  fracture.  Given her young age and high activity level, I felt that total hip arthroplasty with the appropriate course of action for her treatment.  She was brought to the operating room for this procedure.  DESCRIPTION OF PROCEDURE:  The patient brought to the operating room.  After adequate anesthesia was obtained with a general anesthetic, the patient was placed supine on the operating table.  The right hip was then prepped and draped in the usual sterile  fashion after she was placed on the Hana bed for allowing of traction for anterior approach to the hip.  The patient was prepped and draped in the usual sterile fashion and following this, an incision was made for an anterior approach to the hip,  subcutaneous tissue to the level of the tensor fascia and the fascia was divided in line with its fibers.  The muscle was finger dissected and retractors were put in place above and below the femoral neck.   The bleeders in this interval were cauterized  at this point.  Attention was then turned towards the hip where the fascia was opened and once the fascia was opened, it was tagged and retractors put in place above and below the femoral neck and the capsule was released anteriorly.  Once this was  completed, attention was turned towards provisional neck cut, which was made and then the hip ball was removed.  Retractors put in place above and below the acetabulum and the acetabulum was sequentially reamed to a level of 49 mm.  A 50 mm porous coated  Pinnacle cup was hammered into place, 45 degrees of lateral opening, 30 degrees of anteversion.  A hole eliminator was placed and a neutral liner was placed.  At this point, attention was then turned towards the stem.  An arm bar was placed under the  femur.  The hip was externally rotated, adducted and extended.  The posterior capsule was released and attention was turned towards the stem where the canal was sequentially broached to a level of 11 mm.  We tried a 12 broach, but it just really did not  want to go down, so I countersunk the 11 a little bit more as it initially had a good fit.  I did a trial reduction.  Leg lengths were perfect at this point.  I dislocated it and brought it back up.  At that point, it wiggled a little bit.  I tried the  12 again.  It just would not go down, so I took  the 11, put a little bit deeper, calcar planed down to it and then ultimately put the size 11 stem in.  We then trialed with a +5.  It was just a touch long, so we then dislocated the hip and put a final  +1.5 delta ceramic hip ball, 32 mm in place.  Once the flow final fluoroscopy was used to make sure that we had adequate leg length and appropriate offset, the hip was irrigated thoroughly.  Topical tranexamic acid was used in the wound for 3 minutes to  help with blood loss and blood control.  Following this, the capsule was identified and closed with #1 Vicryl running.   The tensor fascia was then closed with 0 Vicryl running.  The skin was then closed with  a combination of 2-0 Vicryl and 3-0 Monocryl  subcuticular.  Benzoin, Steri-Strips, dry sterile compressive dressing was applied.  The patient was taken to recovery and was noted to be in satisfactory condition.  Of note, the patient had previously had an anterior tibialis repair and she was in a  short leg cast.  We carefully removed the cast before the start of the procedure.  We held the foot in dorsiflexion the entire time and placed her into a boot.  Following the procedure, we carefully removed her from the boot and put her into a Cam walker  care, being taken the entire time to maintain dorsiflexion of the ankle.  Once she was in the boot, she was transferred to the recovery room.  She was noted to be in satisfactory condition.  Estimated blood loss for procedure was 700 mL.  Harmon Dun  was present for the entire case and assisted by retraction, manipulation of tissues and closing to minimize OR time.  VN/NUANCE  D:07/25/2020 T:07/25/2020 JOB:[id number]

## 2020-07-25 NOTE — ED Provider Notes (Addendum)
[facility] EMERGENCY DEPARTMENT Provider Note   CSN: [id number] Arrival date & time: 07/25/20  0351     History Chief Complaint  Patient presents with  . Fall    Julie [name] is a 59 y.o. female.  Patient presents to the emergency department for evaluation of right leg injury.  Patient reports that she tripped and fell around 5 PM and has been having severe pain from the right knee to the right buttock area.  She cannot bear weight.  Pain worsens with movement of the hip.  She did not hit her head or lose consciousness.        Past Medical History:  Diagnosis Date  . Depression   . Diabetes mellitus without complication ([place])   . Hypertension     There are no problems to display for this patient.   Past Surgical History:  Procedure Laterality Date  . ABDOMINAL HYSTERECTOMY    . CESAREAN SECTION       OB History   No obstetric history on file.     No family history on file.  Social History   Tobacco Use  . Smoking status: Current Every Day Smoker    Packs/day: 0.50  Substance Use Topics  . Alcohol use: Never  . Drug use: Never    Home Medications Prior to Admission medications   Medication Sig Start Date End Date Taking? Authorizing Provider  buPROPion (WELLBUTRIN XL) 150 MG 24 hr tablet Take 150 mg by mouth every morning. 07/20/20  Yes [provider]  DULoxetine (CYMBALTA) 60 MG capsule Take 60 mg by mouth daily. 03/25/19  Yes [provider]  ibuprofen (ADVIL) 200 MG tablet Take 800 mg by mouth every 8 (eight) hours as needed.   Yes [provider]  lisinopril-hydrochlorothiazide (ZESTORETIC) 20-25 MG tablet Take 1 tablet by mouth daily. 06/14/20  Yes [provider]  metFORMIN (GLUCOPHAGE) 500 MG tablet Take 500 mg by mouth 2 (two) times daily. 05/14/20  Yes [provider]  omeprazole (PRILOSEC) 20 MG capsule Take 20 mg by mouth daily. 05/24/20  Yes [provider]  oxyCODONE (OXY  IR/ROXICODONE) 5 MG immediate release tablet Take 5 mg by mouth every 4 (four) hours as needed for severe pain.   Yes [provider]  potassium chloride (KLOR-CON) 10 MEQ tablet Take 10 mEq by mouth daily. 08/29/16  Yes [provider]  rosuvastatin (CRESTOR) 10 MG tablet Take 10 mg by mouth daily. 07/20/20  Yes [provider]  senna (SENOKOT) 8.6 MG TABS tablet Take 1 tablet by mouth every evening.   Yes [provider]  zolpidem (AMBIEN) 10 MG tablet Take 10 mg by mouth at bedtime. 07/20/20  Yes [provider]    Allergies    Penicillins and Robitussin cold+flu daytime [dm-phenylephrine-acetaminophen]  Review of Systems   Review of Systems  Musculoskeletal: Positive for arthralgias.  All other systems reviewed and are negative.   Physical Exam Updated Vital Signs BP (!) 155/90   Pulse 92   Temp 97.9 F (36.6 C)   Resp (!) 22   Ht 5\' 6"  (1.676 m)   Wt 77.1 kg   SpO2 99%   BMI 27.44 kg/m   Physical Exam Vitals and nursing note reviewed.  Constitutional:      General: She is in acute distress.     Appearance: Normal appearance. She is well-developed.  HENT:     Head: Normocephalic and atraumatic.     Right Ear: Hearing  normal.     Left Ear: Hearing normal.     Nose: Nose normal.  Eyes:     Conjunctiva/sclera: Conjunctivae normal.     Pupils: Pupils are equal, round, and reactive to light.  Cardiovascular:     Rate and Rhythm: Regular rhythm.     Pulses:          Dorsalis pedis pulses are 2+ on the right side.     Heart sounds: S1 normal and S2 normal. No murmur heard.  No friction rub. No gallop.   Pulmonary:     Effort: Pulmonary effort is normal. No respiratory distress.     Breath sounds: Normal breath sounds.  Chest:     Chest wall: No tenderness.  Abdominal:     General: Bowel sounds are normal.     Palpations: Abdomen is soft.     Tenderness: There is no abdominal tenderness. There is no guarding or rebound.  Negative signs include Murphy's sign and McBurney's sign.     Hernia: No hernia is present.  Musculoskeletal:     Cervical back: Normal range of motion and neck supple.     Right hip: Tenderness and bony tenderness present. Decreased range of motion. Decreased strength.     Right knee: No swelling, deformity, effusion or erythema. Normal range of motion. Tenderness present.  Skin:    General: Skin is warm and dry.     Findings: No rash.  Neurological:     Mental Status: She is alert and oriented to person, place, and time.     GCS: GCS eye subscore is 4. GCS verbal subscore is 5. GCS motor subscore is 6.     Cranial Nerves: No cranial nerve deficit.     Sensory: No sensory deficit.     Coordination: Coordination normal.  Psychiatric:        Speech: Speech normal.        Behavior: Behavior normal.        Thought Content: Thought content normal.     ED Results / Procedures / Treatments   Labs (all labs ordered are listed, but only abnormal results are displayed) Labs Reviewed  CBC - Abnormal; Notable for the following components:      Result Value   WBC 14.9 (*)    RBC 5.72 (*)    Hemoglobin 9.6 (*)    HCT 34.4 (*)    MCV 60.1 (*)    MCH 16.8 (*)    MCHC 27.9 (*)    RDW 18.7 (*)    Platelets 401 (*)    All other components within normal limits  BASIC METABOLIC PANEL - Abnormal; Notable for the following components:   Sodium 131 (*)    Chloride 95 (*)    CO2 21 (*)    Glucose, Bld 162 (*)    Creatinine, Ser 1.25 (*)    GFR calc non Af Amer 47 (*)    GFR calc Af Amer 55 (*)    All other components within normal limits  SARS CORONAVIRUS 2 BY RT PCR (HOSPITAL ORDER, [facility])    EKG None  Radiology DG Chest 1 View  Result Date: 07/25/2020 CLINICAL DATA:  Fall. EXAM: CHEST  1 VIEW COMPARISON:  06/26/2016.  CT 08/30/2016. FINDINGS: Mediastinum and hilar structures normal. Heart size normal. Low lung volumes. No focal infiltrate. No pleural  effusion or pneumothorax. No evidence of acute displaced rib fracture. Thoracic spine scoliosis. 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 IMPRESSION: 1. No acute cardiopulmonary disease. No evidence of displaced rib fracture or pneumothorax. 2. 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 Electronically Signed   By: Marcello Moores  Register   On: 07/25/2020 05:35   DG Knee Complete 4 Views Right  Result Date: 07/25/2020 CLINICAL DATA:  Right femoral neck fracture. EXAM: RIGHT KNEE - COMPLETE 4+ VIEW COMPARISON:  None. FINDINGS: Bedding and atypical positioning due to ipsilateral fracture. No evidence of fracture, dislocation, or joint effusion. IMPRESSION: Negative. Electronically Signed   By: Monte Fantasia M.D.   On: 07/25/2020 05:33   DG Hip Unilat W or Wo Pelvis 2-3 Views Right  Result Date: 07/25/2020 CLINICAL DATA:  Fall at home EXAM: DG HIP (WITH OR WITHOUT PELVIS) 2-3V RIGHT COMPARISON:  None. FINDINGS: Right femoral neck fracture with varus angulation. Limited rotated assessment of the pelvis without detected fracture. No significant acetabular spurring. IMPRESSION: Right femoral neck fracture with varus angulation. Electronically Signed   By: Monte Fantasia M.D.   On: 07/25/2020 05:32   DG Femur Min 2 Views Right  Result Date: 07/25/2020 CLINICAL DATA:  Fall with hip pain EXAM: RIGHT FEMUR 2 VIEWS COMPARISON:  None. FINDINGS: Right femoral neck fracture with varus angulation. The mid and distal femur are intact and located. No significant acetabular spurring. IMPRESSION: Right femoral neck fracture with varus angulation. Electronically Signed   By: Monte Fantasia M.D.   On: 07/25/2020 05:32    Procedures Procedures (including critical care time)  Medications Ordered in ED Medications  HYDROmorphone (DILAUDID) injection 1 mg (has no administration in time range)  HYDROmorphone  (DILAUDID) injection 1 mg (1 mg Intravenous Given 07/25/20 0450)  ondansetron (ZOFRAN) injection 4 mg (4 mg Intravenous Given 07/25/20 0451)    ED Course  I have reviewed the triage vital signs and the nursing notes.  Pertinent labs & imaging results that were available during my care of the patient were reviewed by me and considered in my medical decision making (see chart for details).    MDM Rules/Calculators/A&P                          Patient presents to the emergency department for evaluation of right leg injury.  Patient reports that she tripped over a toy causing her to fall.  She fell directly onto the knee and then to the ground.  No evidence of head injury.  Patient has a cast on her lower leg after having ankle surgery in July.  Examination reveals significant pain with movement of the hip with decreased range of motion.  X-ray confirms femoral neck fracture.  Case discussed with Dr. Grandville Silos, on-call for [facility] orthopedics.  Patient will be seen by either Dr. Lucia Gaskins or one of the other partners of the group today.  Admit to hospitalist.  Possible surgery later, will keep n.p.o.  Final Clinical Impression(s) / ED Diagnoses Final diagnoses:  Closed fracture of neck of right femur, initial encounter [facility])    Rx / DC Orders ED Discharge Orders    None       Juliann Olesky, Gwenyth Allegra, MD 07/25/20 0938    Orpah Greek, MD 07/25/20 [phone]

## 2020-07-25 NOTE — Transfer of Care (Signed)
Immediate Anesthesia Transfer of Care Note  Patient: Julie [name]  Procedure(s) Performed: RIGHT TOTAL HIP ARTHROPLASTY (Right Hip)  Patient Location: PACU  Anesthesia Type:General  Level of Consciousness: awake, alert , oriented and patient cooperative  Airway & Oxygen Therapy: Patient Spontanous Breathing and Patient connected to nasal cannula oxygen  Post-op Assessment: Report given to RN and Post -op Vital signs reviewed and stable  Post vital signs: Reviewed and stable  Last Vitals:  Vitals Value Taken Time  BP 123/71 07/25/20 1851  Temp    Pulse 91 07/25/20 1854  Resp 20 07/25/20 1854  SpO2 100 % 07/25/20 1854  Vitals shown include unvalidated device data.  Last Pain:  Vitals:   07/25/20 1411  PainSc: 6          Complications: No complications documented.

## 2020-07-25 NOTE — Anesthesia Preprocedure Evaluation (Addendum)
Anesthesia Evaluation  Patient identified by MRN, date of birth, ID band Patient awake    Reviewed: Allergy & Precautions, NPO status , Patient's Chart, lab work & pertinent test results  History of Anesthesia Complications Negative for: history of anesthetic complications  Airway Mallampati: II  TM Distance: >3 FB Neck ROM: Full    Dental  (+) Dental Advisory Given   Pulmonary Current Smoker and Patient abstained from smoking.,  07/25/2020 SARS coronavirus NEG   breath sounds clear to auscultation       Cardiovascular hypertension, Pt. on medications (-) angina Rhythm:Regular Rate:Normal  ECG: SR, rate 86   Neuro/Psych PSYCHIATRIC DISORDERS Depression negative neurological ROS     GI/Hepatic Neg liver ROS, GERD  Medicated and Controlled,  Endo/Other  diabetes (glu 131), Oral Hypoglycemic Agents  Renal/GU negative Renal ROS     Musculoskeletal negative musculoskeletal ROS (+)   Abdominal   Peds  Hematology  (+) Blood dyscrasia (Hb 9.6), anemia , HLD   Anesthesia Other Findings Right femoral neck fracture  Reproductive/Obstetrics                           Anesthesia Physical Anesthesia Plan  ASA: III  Anesthesia Plan: General   Post-op Pain Management:    Induction: Intravenous  PONV Risk Score and Plan: 2 and Ondansetron and Dexamethasone  Airway Management Planned: Oral ETT  Additional Equipment: None  Intra-op Plan:   Post-operative Plan: Extubation in OR  Informed Consent: I have reviewed the patients History and Physical, chart, labs and discussed the procedure including the risks, benefits and alternatives for the proposed anesthesia with the patient or authorized representative who has indicated his/her understanding and acceptance.     Dental advisory given  Plan Discussed with: CRNA and Surgeon  Anesthesia Plan Comments:       Anesthesia Quick Evaluation

## 2020-07-25 NOTE — Brief Op Note (Signed)
07/25/2020  5:52 PM  PATIENT:  Julie [name]  59 y.o. female  PRE-OPERATIVE DIAGNOSIS:  Right femoral neck fracture  POST-OPERATIVE DIAGNOSIS:  Right femoral neck fracture  PROCEDURE:  Procedure(s): RIGHT TOTAL HIP ARTHROPLASTY (Right)  SURGEON:  Surgeon(s) and Role:    Dorna Leitz, MD - Primary  PHYSICIAN ASSISTANT:   ASSISTANTS: Filbert Berthold   ANESTHESIA:   general  EBL:  700 mL   BLOOD ADMINISTERED:none  DRAINS: none   LOCAL MEDICATIONS USED:  MARCAINE    and OTHER experel  SPECIMEN:  No Specimen  DISPOSITION OF SPECIMEN:  N/A  COUNTS:  YES  TOURNIQUET:  * No tourniquets in log *  DICTATION: .Other Dictation: Dictation Number [id number]  PLAN OF CARE: Admit to inpatient   PATIENT DISPOSITION:  PACU - hemodynamically stable.   Delay start of Pharmacological VTE agent (>24hrs) due to surgical blood loss or risk of bleeding: no

## 2020-07-25 NOTE — Consult Note (Signed)
Reason for Consult:Right femoral neck fracture Referring Physician: Hospitalists  Julie [name] is an 59 y.o. female.  HPI: The patient is a 59 year old female who underwent soft tissue ankle surgery 3 weeks ago.  She unfortunately fell at home and suffered severe right hip pain.  She is brought to the emergency room where she is noted to have a displaced femoral neck fracture.  I was consulted by Dr. Lucia Gaskins for evaluation and treatment.  Past Medical History:  Diagnosis Date  . Depression   . Diabetes mellitus without complication ([place])   . Hypertension     Past Surgical History:  Procedure Laterality Date  . ABDOMINAL HYSTERECTOMY    . CESAREAN SECTION      No family history on file.  Social History:  reports that she has been smoking. She has been smoking about 0.50 packs per day. She does not have any smokeless tobacco history on file. She reports that she does not drink alcohol and does not use drugs.  Allergies:  Allergies  Allergen Reactions  . Penicillins   . Robitussin Cold+Flu Daytime [Dm-Phenylephrine-Acetaminophen]     Medications: I have reviewed the patient's current medications.  Results for orders placed or performed during the hospital encounter of 07/25/20 (from the past 48 hour(s))  CBC     Status: Abnormal   Collection Time: 07/25/20  4:50 AM  Result Value Ref Range   WBC 14.9 (H) 4.0 - 10.5 K/uL   RBC 5.72 (H) 3.87 - 5.11 MIL/uL   Hemoglobin 9.6 (L) 12.0 - 15.0 g/dL   HCT 34.4 (L) 36 - 46 %   MCV 60.1 (L) 80.0 - 100.0 fL   MCH 16.8 (L) 26.0 - 34.0 pg   MCHC 27.9 (L) 30.0 - 36.0 g/dL   RDW 18.7 (H) 11.5 - 15.5 %   Platelets 401 (H) 150 - 400 K/uL    Comment: REPEATED TO VERIFY   nRBC 0.0 0.0 - 0.2 %    Comment: Performed at [facility], [address], [place], [place] [address]  Basic metabolic panel     Status: Abnormal   Collection Time: 07/25/20  4:50 AM  Result Value Ref Range   Sodium 131 (L) 135 - 145 mmol/L   Potassium 3.9 3.5 - 5.1  mmol/L   Chloride 95 (L) 98 - 111 mmol/L   CO2 21 (L) 22 - 32 mmol/L   Glucose, Bld 162 (H) 70 - 99 mg/dL    Comment: Glucose reference range applies only to samples taken after fasting for at least 8 hours.   BUN 20 6 - 20 mg/dL   Creatinine, Ser 1.25 (H) 0.44 - 1.00 mg/dL   Calcium 10.1 8.9 - 10.3 mg/dL   GFR calc non Af Amer 47 (L) >60 mL/min   GFR calc Af Amer 55 (L) >60 mL/min   Anion gap 15 5 - 15    Comment: Performed at [facility] [address], [place], [place] [address]  Hemoglobin A1c     Status: Abnormal   Collection Time: 07/25/20  4:50 AM  Result Value Ref Range   Hgb A1c MFr Bld 7.0 (H) 4.8 - 5.6 %    Comment: (NOTE) Pre diabetes:          5.7%-6.4%  Diabetes:              >6.4%  Glycemic control for   <7.0% adults with diabetes    Mean Plasma Glucose 154.2 mg/dL  Comment: Performed at [facility], [place] [address], [place], [place] [address]  SARS Coronavirus 2 by RT PCR (hospital order, performed in [facility] hospital lab) Nasopharyngeal Nasopharyngeal Swab     Status: None   Collection Time: 07/25/20  5:39 AM   Specimen: Nasopharyngeal Swab  Result Value Ref Range   SARS Coronavirus 2 NEGATIVE NEGATIVE    Comment: (NOTE) SARS-CoV-2 target nucleic acids are NOT DETECTED.  The SARS-CoV-2 RNA is generally detectable in upper and lower respiratory specimens during the acute phase of infection. The lowest concentration of SARS-CoV-2 viral copies this assay can detect is 250 copies / mL. A negative result does not preclude SARS-CoV-2 infection and should not be used as the sole basis for treatment or other patient management decisions.  A negative result may occur with improper specimen collection / handling, submission of specimen other than nasopharyngeal swab, presence of viral mutation(s) within the areas targeted by this assay, and inadequate number of viral copies (<250 copies / mL). A negative result must be combined with  clinical observations, patient history, and epidemiological information.  Fact Sheet for Patients:   StrictlyIdeas.no  Fact Sheet for Healthcare Providers: BankingDealers.co.za  This test is not yet approved or  cleared by the [place] FDA and has been authorized for detection and/or diagnosis of SARS-CoV-2 by FDA under an Emergency Use Authorization (EUA).  This EUA will remain in effect (meaning this test can be used) for the duration of the COVID-19 declaration under Section 564(b)(1) of the Act, 21 U.S.C. section 360bbb-3(b)(1), unless the authorization is terminated or revoked sooner.  Performed at [facility], [place] [address] [place], [place] [address]   CBG monitoring, ED     Status: Abnormal   Collection Time: 07/25/20  8:35 AM  Result Value Ref Range   Glucose-Capillary 145 (H) 70 - 99 mg/dL    Comment: Glucose reference range applies only to samples taken after fasting for at least 8 hours.    DG Chest 1 View  Result Date: 07/25/2020 CLINICAL DATA:  Fall. EXAM: CHEST  1 VIEW COMPARISON:  06/26/2016.  CT 08/30/2016. FINDINGS: Mediastinum and hilar structures normal. Heart size normal. Low lung volumes. No focal infiltrate. No pleural effusion or pneumothorax. No evidence of acute displaced rib fracture. Thoracic spine scoliosis. 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 IMPRESSION: 1. No acute cardiopulmonary disease. No evidence of displaced rib fracture or pneumothorax. 2. 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 Electronically Signed   By: Marcello Moores  Register   On: 07/25/2020 05:35   DG Knee Complete 4 Views Right  Result Date: 07/25/2020 CLINICAL DATA:  Right femoral neck fracture. EXAM: RIGHT KNEE - COMPLETE 4+ VIEW COMPARISON:  None. FINDINGS: Bedding and atypical positioning due to ipsilateral  fracture. No evidence of fracture, dislocation, or joint effusion. IMPRESSION: Negative. Electronically Signed   By: Monte Fantasia M.D.   On: 07/25/2020 05:33   DG Hip Unilat W or Wo Pelvis 2-3 Views Right  Result Date: 07/25/2020 CLINICAL DATA:  Fall at home EXAM: DG HIP (WITH OR WITHOUT PELVIS) 2-3V RIGHT COMPARISON:  None. FINDINGS: Right femoral neck fracture with varus angulation. Limited rotated assessment of the pelvis without detected fracture. No significant acetabular spurring. IMPRESSION: Right femoral neck fracture with varus angulation. Electronically Signed   By: Monte Fantasia M.D.   On: 07/25/2020 05:32   DG Femur Min 2 Views  Right  Result Date: 07/25/2020 CLINICAL DATA:  Fall with hip pain EXAM: RIGHT FEMUR 2 VIEWS COMPARISON:  None. FINDINGS: Right femoral neck fracture with varus angulation. The mid and distal femur are intact and located. No significant acetabular spurring. IMPRESSION: Right femoral neck fracture with varus angulation. Electronically Signed   By: Monte Fantasia M.D.   On: 07/25/2020 05:32    ROS  ROS: I have reviewed the patient's review of systems thoroughly and there are no positive responses as relates to the HPI. Blood pressure 132/63, pulse 89, temperature 97.9 F (36.6 C), resp. rate 18, height 5\' 6"  (1.676 m), weight 77.1 kg, SpO2 94 %. Physical Exam Well-developed well-nourished patient in no acute distress. Alert and oriented x3 HEENT:within normal limits Cardiac: Regular rate and rhythm Pulmonary: Lungs clear to auscultation Abdomen: Soft and nontender.  Normal active bowel sounds  Musculoskeletal: (Right leg actually rotated and shortened.  Pain with all range of motion.  Right ankle and knee short leg cast. Assessment/Plan: Patient is a 59 year old otherwise healthy female who fell at home suffering a displaced femoral neck fracture.//We had a long discussion of treatment options but given her age and activity level I think that she would  benefit from total hip replacement versus hemiarthroplasty.I have had a prolonged discussion with the patient regarding the risk and benefits of the surgical procedure.  The patient understands the risks include but are not limited to bleeding, infection and failure of the surgery to cure the problem and need for further surgery.  The patient understands there is a slight risk of death at the time of surgery.  The patient understands these risks along with the potential benefits and wishes to proceed with surgical intervention.   The patient will be followed in the office in the postoperative period.     Alta Corning 07/25/2020, 9:16 AM

## 2020-07-25 NOTE — Progress Notes (Signed)
I have been asked to take on care of patient.  I will see her this morning and surgery is planned when OR available which is 4:00 today. Dorna Leitz [phone]

## 2020-07-25 NOTE — Progress Notes (Signed)
Orthopedic Tech Progress Note Patient Details:  Julie [name] 1960-12-07 [id number] OR RN called requesting a CAM WALKER for patient. Dropped off to desk Ortho Devices Type of Ortho Device: CAM walker Ortho Device/Splint Location: RLE Ortho Device/Splint Interventions: Other (comment)   Post Interventions Patient Tolerated: Other (comment) Instructions Provided: Other (comment)   [name] 07/25/2020, 5:16 PM

## 2020-07-25 NOTE — Progress Notes (Signed)
Orthopedic Tech Progress Note Patient Details:  Julie [name] 1961/02/04 [id number] RN called requesting I BIVALVE patient CAST PER MD. Applied ace around leg Casting Type of Cast: Short leg cast Cast Location: RLE Cast Material: Fiberglass Cast Intervention: Bivalve  Post Interventions Patient Tolerated: Well Instructions Provided: Care of [name] 07/25/2020, 8:58 AM

## 2020-07-25 NOTE — ED Notes (Signed)
Pt to OR at this time.

## 2020-07-25 NOTE — ED Triage Notes (Signed)
Pt from home by EMS for a fall. Pt had a tendon repair to R ankle on 06/20/20. Last night, pt tripped over a toy and fell onto R knee. Reports increased pain to R knee radiating into R groin. EMS gave 200 mcg Fentanyl enroute without improvement

## 2020-07-26 ENCOUNTER — Encounter (HOSPITAL_COMMUNITY): Payer: Self-pay | Admitting: Orthopedic Surgery

## 2020-07-26 DIAGNOSIS — S72001A Fracture of unspecified part of neck of right femur, initial encounter for closed fracture: Secondary | ICD-10-CM

## 2020-07-26 DIAGNOSIS — Z96641 Presence of right artificial hip joint: Secondary | ICD-10-CM

## 2020-07-26 LAB — GLUCOSE, CAPILLARY
Glucose-Capillary: 171 mg/dL — ABNORMAL HIGH (ref 70–99)
Glucose-Capillary: 148 mg/dL — ABNORMAL HIGH (ref 70–99)
Glucose-Capillary: 169 mg/dL — ABNORMAL HIGH (ref 70–99)
Glucose-Capillary: 116 mg/dL — ABNORMAL HIGH (ref 70–99)

## 2020-07-26 LAB — CBC
HCT: 29.6 % — ABNORMAL LOW (ref 36.0–46.0)
Hemoglobin: 8.9 g/dL — ABNORMAL LOW (ref 12.0–15.0)
MCH: 20 pg — ABNORMAL LOW (ref 26.0–34.0)
MCHC: 30.1 g/dL (ref 30.0–36.0)
MCV: 66.5 fL — ABNORMAL LOW (ref 80.0–100.0)
Platelets: 279 10*3/uL (ref 150–400)
RBC: 4.45 MIL/uL (ref 3.87–5.11)
RDW: 25.5 % — ABNORMAL HIGH (ref 11.5–15.5)
WBC: 14.7 10*3/uL — ABNORMAL HIGH (ref 4.0–10.5)
nRBC: 0.2 % (ref 0.0–0.2)

## 2020-07-26 LAB — BASIC METABOLIC PANEL
Anion gap: 9 (ref 5–15)
BUN: 15 mg/dL (ref 6–20)
CO2: 25 mmol/L (ref 22–32)
Calcium: 9 mg/dL (ref 8.9–10.3)
Chloride: 96 mmol/L — ABNORMAL LOW (ref 98–111)
Creatinine, Ser: 1.04 mg/dL — ABNORMAL HIGH (ref 0.44–1.00)
GFR calc Af Amer: >60 mL/min (ref >60)
GFR calc non Af Amer: 59 mL/min — ABNORMAL LOW (ref >60)
Glucose, Bld: 214 mg/dL — ABNORMAL HIGH (ref 70–99)
Potassium: 4.8 mmol/L (ref 3.5–5.1)
Sodium: 130 mmol/L — ABNORMAL LOW (ref 135–145)

## 2020-07-26 LAB — FERRITIN: Ferritin: 36 ng/mL (ref 11–307)

## 2020-07-26 LAB — IRON AND TIBC
Iron: 109 ug/dL (ref 28–170)
Saturation Ratios: 26 % (ref 10.4–31.8)
TIBC: 413 ug/dL (ref 250–450)
UIBC: 304 ug/dL

## 2020-07-26 LAB — MAGNESIUM: Magnesium: 1.6 mg/dL — ABNORMAL LOW (ref 1.7–2.4)

## 2020-07-26 MED ORDER — MAGNESIUM SULFATE 2 GM/50ML IV SOLN
2.0000 g | Freq: Once | INTRAVENOUS | Status: AC
  Administered 2020-07-26: 2 g via INTRAVENOUS
  Filled 2020-07-26: qty 50

## 2020-07-26 MED ORDER — ASPIRIN 325 MG PO TABS
325.0000 mg | ORAL_TABLET | Freq: Every day | ORAL | Status: DC
  Administered 2020-07-27 – 2020-07-29 (×3): 325 mg via ORAL
  Filled 2020-07-26 (×3): qty 1

## 2020-07-26 MED ORDER — LISINOPRIL 5 MG PO TABS
5.0000 mg | ORAL_TABLET | Freq: Every day | ORAL | Status: DC
  Administered 2020-07-26 – 2020-07-27 (×2): 5 mg via ORAL
  Filled 2020-07-26 (×2): qty 1

## 2020-07-26 NOTE — Evaluation (Signed)
Physical Therapy Evaluation Patient Details Name: Julie [name] MRN: [id number] DOB: [birth year] Today's Date: 07/26/2020   History of Present Illness  Pt is a 59 y.o. F with significant PMH of diabetes mellitus,and recent R soft tissue ankle surgery 3 weeks ago who presents after a fall with a right displaced femoral neck fracture. Now s/p right total hip arthroplasty 8/31.  Clinical Impression  Prior to admission, pt lives alone and works as a Marine scientist in hospice care. Recently, pt has been recovering from a right ankle surgery and ambulating with a CAM boot. She plans to have her daughter stay with her initially following discharge. Pt presents with decreased functional mobility secondary to right pain and right lower extremity weakness. Pt ambulating 15 feet with a walker at a min guard assist level. Initiated seated HEP program for strengthening. Suspect steady progress with mobility. Recommending HHPT to address deficits and maximize functional independence.      Follow Up Recommendations Home health PT;Supervision for mobility/OOB    Equipment Recommendations  None recommended by PT    Recommendations for Other Services       Precautions / Restrictions Precautions Precautions: Fall Required Braces or Orthoses: Other Brace Other Brace: R CAM Restrictions Weight Bearing Restrictions: No      Mobility  Bed Mobility Overal bed mobility: Needs Assistance Bed Mobility: Supine to Sit     Supine to sit: Min assist     General bed mobility comments: MinA for RLE negotiation out of bed, increased time/effort, cues for use of bed rail.   Transfers Overall transfer level: Needs assistance Equipment used: Rolling walker (2 wheeled) Transfers: Sit to/from Stand Sit to Stand: Min assist         General transfer comment: MinA to boost to rise from elevated bed height, cues for hand placement and transition to walker  Ambulation/Gait Ambulation/Gait assistance: Min guard Gait  Distance (Feet): 15 Feet Assistive device: Rolling walker (2 wheeled) Gait Pattern/deviations: Step-to pattern;Decreased stance time - right;Decreased weight shift to right;Antalgic Gait velocity: decreased   General Gait Details: Cues for sequencing, min guard for safety, chair follow utilized.  Stairs            Wheelchair Mobility    Modified Rankin (Stroke Patients Only)       Balance Overall balance assessment: Needs assistance Sitting-balance support: Feet supported Sitting balance-Leahy Scale: Good     Standing balance support: Bilateral upper extremity supported Standing balance-Leahy Scale: Poor Standing balance comment: reliant on external support                             Pertinent Vitals/Pain Pain Assessment: Faces Faces Pain Scale: Hurts even more Pain Location: R hip Pain Descriptors / Indicators: Operative site guarding;Grimacing Pain Intervention(s): Monitored during session;Premedicated before session;Repositioned;Ice applied    Home Living Family/patient expects to be discharged to:: Private residence Living Arrangements: Alone Available Help at Discharge: Family (daughter plans to stay) Type of Home: House Home Access: Stairs to enter   [employer] of Steps: 1 (curb step) Home Layout: One level Home Equipment: Shower seat;Wheelchair - [employer] - 2 wheels;Walker - 4 wheels      Prior Function Level of Independence: Needs assistance   Gait / Transfers Assistance Needed: using CAM walker for ambulation, no AD since cleared for weightbearing  ADL's / Homemaking Assistance Needed: Independent ADL's  Comments: Recent right ankle fx, WBAT with CAM boot. Works as a Marine scientist  Hand Dominance        Extremity/Trunk Assessment   Upper Extremity Assessment Upper Extremity Assessment: Overall WFL for tasks assessed    Lower Extremity Assessment Lower Extremity Assessment: RLE deficits/detail RLE Deficits /  Details: s/p THA. Knee extension 3/5, hip flexion 2/5    Cervical / Trunk Assessment Cervical / Trunk Assessment: Normal  Communication   Communication: No difficulties  Cognition Arousal/Alertness: Awake/alert Behavior During Therapy: WFL for tasks assessed/performed Overall Cognitive Status: Within Functional Limits for tasks assessed                                        General Comments      Exercises Total Joint Exercises Hip ABduction/ADduction: Both;5 reps;Seated Long Arc Quad: Right;Seated;5 reps General Exercises - Lower Extremity Hip Flexion/Marching: Right;5 reps;Seated   Assessment/Plan    PT Assessment Patient needs continued PT services  PT Problem List Decreased strength;Decreased activity tolerance;Decreased balance;Decreased mobility;Pain       PT Treatment Interventions DME instruction;Gait training;Stair training;Functional mobility training;Therapeutic activities;Therapeutic exercise;Balance training;Patient/family education    PT Goals (Current goals can be found in the Care Plan section)  Acute Rehab PT Goals Patient Stated Goal: improve mobility, not fall again PT Goal Formulation: With patient Time For Goal Achievement: 08/09/20 Potential to Achieve Goals: Good    Frequency Min 5X/week   Barriers to discharge        Co-evaluation               AM-PAC PT "6 Clicks" Mobility  Outcome Measure Help needed turning from your back to your side while in a flat bed without using bedrails?: None Help needed moving from lying on your back to sitting on the side of a flat bed without using bedrails?: A Little Help needed moving to and from a bed to a chair (including a wheelchair)?: A Little Help needed standing up from a chair using your arms (e.g., wheelchair or bedside chair)?: A Little Help needed to walk in hospital room?: A Little Help needed climbing 3-5 steps with a railing? : A Lot 6 Click Score: 18    End of  Session Equipment Utilized During Treatment: Gait belt Activity Tolerance: Patient tolerated treatment well Patient left: in chair;with call bell/phone within reach;with family/visitor present Nurse Communication: Mobility status PT Visit Diagnosis: Pain;Difficulty in walking, not elsewhere classified (R26.2);Other abnormalities of gait and mobility (R26.89) Pain - Right/Left: Right Pain - part of body: Hip    Time: 0952-1026 PT Time Calculation (min) (ACUTE ONLY): 34 min   Charges:   PT Evaluation $PT Eval Low Complexity: 1 Low PT Treatments $Gait Training: 8-22 mins          Wyona Almas, PT, DPT Acute Rehabilitation Services Pager [phone] Office [phone]   Deno Etienne 07/26/2020, 12:49 PM

## 2020-07-26 NOTE — TOC CAGE-AID Note (Signed)
Transition of Care [facility]) - CAGE-AID Screening   Patient Details  Name: Julie [name] MRN: [id number] Date of Birth: 03/22/1961  [facility] of [facility]) CM/SW Contact:    Emeterio Reeve, [place] Phone Number: 07/26/2020, 11:38 AM   Clinical Narrative:  CSW met with pt at bedside. CSW introduced self and explained her role at the hospital.  PT denies alcohol use and substance use. Pt did not need any resources at this time.    CAGE-AID Screening:    Have You Ever Felt You Ought to Cut Down on Your Drinking or Drug Use?: No Have People Annoyed You By Critizing Your Drinking Or Drug Use?: No Have You Felt Bad Or Guilty About Your Drinking Or Drug Use?: No Have You Ever Had a Drink or Used Drugs First Thing In The Morning to Steady Your Nerves or to Get Rid of a Hangover?: No CAGE-AID Score: 0  Substance Abuse Education Offered: Yes    Blima Ledger, [place] Social Worker [phone]

## 2020-07-26 NOTE — Plan of Care (Signed)
  Problem: Education: Goal: Verbalization of understanding the information provided (i.e., activity precautions, restrictions, etc) will improve Outcome: Progressing Goal: Individualized Educational Video(s) Outcome: Progressing   

## 2020-07-26 NOTE — Progress Notes (Addendum)
PROGRESS NOTE    Julie [name]  JME:[id number] DOB: 1961/02/08 DOA: 07/25/2020 PCP: Jenel Lucks, PA-C    Brief Narrative:  Patient was admitted to the hospital with a working diagnosis of acute right femoral neck fracture  59 year old female with past medical history for hypertension, type 2 diabetes mellitus, beta thalassemia and depression.  She sustained a mechanical fall while tripping and landing onto her right knee.  Post trauma she has severe local pain at the right knee, hip and groin.  She had significant decreased range of motion due to pain.  She was brought to the hospital for further evaluation.  On her initial physical examination blood pressure 155/90, heart rate 92, respirate 22, temperature 97.9, oxygen saturation 99%.  She had moist mucous membranes, lungs clear to auscultation bilaterally, heart S1-S2, present rhythmic, soft abdomen, no lower extremity edema.  Right foot was in a cast from prior procedure (right foot tendon repair 07/21).  Radiographic evaluation showed a right femoral neck fracture with varus angulation.  Patient was admitted to the medical ward, she received analgesics and DVT prophylaxis.  She was evaluated by orthopedics and underwent right total hip arthroplasty.   Assessment & Plan:   Principal Problem:   Status post total hip replacement, right Active Problems:   Closed displaced fracture of right femoral neck (HCC)   Fall   Thrombocytosis (HCC)   Depression   GERD (gastroesophageal reflux disease)   Type 2 diabetes mellitus with hyperlipidemia (HCC)   Hyponatremia   Leukocytosis   AKI (acute kidney injury) ([place])   Beta thalassemia ([place])   1. Acute right femoral neck fracture. Patient continue to have pain and limited mobility, no nausea or vomiting.   Continue pain control with acetaminophen, hydromorphone, oxycodone and methocarbamol. Continue bowel regimen with Miralax.   Follow up with physical therapy/ occupational  therapy and surgery recommendations.   2. HTN. Will resume blood pressure control with low dose lisinopril. Ok to discontinue telemetry monitoring.   3. T2DM/ dyslipidemia. Continue glucose cover and monitoring with insulin sliding scale. Patient is tolerating po well.   Continue statin therapy with rosuvastatin.   4. Depression. Continue with bupropion and duloxetine.   5. Iron deficiency anemia (mic/ anemia of chronic diease/ post-operative anemia. Iron panel with serum iron at 109, TIBC 413, transferrin saturation 26 and ferritin 36.  Patient had 2 units PRBC transfused trans-operative.  Hgb this am is down to 8,9 and Hct at 29,6.   Will continue with oral iron supplementation , close follow up on Hgb and Hct, if less than 7,0 hgb will plan prbc transfusion.   6. Tobacco abuse. Continue smoking cessation and nicotine patch.   8. Hypomagnesemia/ hyponatremia. Serum Mg down to 1,6. Renal function stable with serum cr at 1,0, K at 4,8 and Na at 130.  Patient tolerating po well, will dc IV fluids and will add 2 g of magnesium sulfate. Follow on renal panel in am.   Status is: Inpatient  Remains inpatient appropriate because:Inpatient level of care appropriate due to severity of illness   Dispo: The patient is from: Home              Anticipated d/c is to: SNF              Anticipated d/c date is: 2 days              Patient currently is not medically stable to d/c.   DVT prophylaxis: Full dose due to bleeding  risk.  Code Status:   full  Family Communication:  I spoke with patient's  daughter at the bedside, we talked in detail about patient's condition, plan of care and prognosis and all questions were addressed.      Consultants:   Orthopedics   Procedures:   Right hip arthroplasty     Subjective: Patient continue to have pain, at the surgical site, limited mobility, no nausea or vomiting,   Objective: Vitals:   07/25/20 2158 07/26/20 0449 07/26/20 0742 07/26/20  0745  BP: 138/79 119/72 121/60 121/60  Pulse: 99 100 92 92  Resp:  17 18 18   Temp: 99 F (37.2 C) 98.5 F (36.9 C) 98.8 F (37.1 C) 98.8 F (37.1 C)  TempSrc: Oral Oral Oral Oral  SpO2: 98% 93% 91% 91%  Weight:      Height:        Intake/Output Summary (Last 24 hours) at 07/26/2020 1229 Last data filed at 07/26/2020 0900 Gross per 24 hour  Intake 4035 ml  Output 2680 ml  Net 1355 ml   Filed Weights   07/25/20 0355  Weight: 77.1 kg    Examination:   General: Not in pain or dyspnea, deconditioned  Neurology: Awake and alert, non focal  E ENT: mild pallor, no icterus, oral mucosa moist Cardiovascular: No JVD. S1-S2 present, rhythmic, no gallops, rubs, or murmurs. No lower extremity edema. Pulmonary: positive breath sounds bilaterally, adequate air movement, no wheezing, rhonchi or rales. Gastrointestinal. Abdomen soft and non tender Skin. No rashes Musculoskeletal: no joint deformities     Data Reviewed: I have personally reviewed following labs and imaging studies  CBC: Recent Labs  Lab 07/25/20 0450 07/25/20 1804 07/26/20 0252  WBC 14.9*  --  14.7*  HGB 9.6* 7.8* 8.9*  HCT 34.4* 23.0* 29.6*  MCV 60.1*  --  66.5*  PLT 401*  --  350   Basic Metabolic Panel: Recent Labs  Lab 07/25/20 0450 07/25/20 1804 07/26/20 0252  NA 131* 135 130*  K 3.9 4.3 4.8  CL 95* 98 96*  CO2 21*  --  25  GLUCOSE 162* 130* 214*  BUN 20 21* 15  CREATININE 1.25* 1.00 1.04*  CALCIUM 10.1  --  9.0  MG  --   --  1.6*   GFR: Estimated Creatinine Clearance: 61.8 mL/min (A) (by C-G formula based on SCr of 1.04 mg/dL (H)). Liver Function Tests: No results for input(s): AST, ALT, ALKPHOS, BILITOT, PROT, ALBUMIN in the last 168 hours. No results for input(s): LIPASE, AMYLASE in the last 168 hours. No results for input(s): AMMONIA in the last 168 hours. Coagulation Profile: No results for input(s): INR, PROTIME in the last 168 hours. Cardiac Enzymes: Recent Labs  Lab  07/25/20 0828  CKTOTAL 132   BNP (last 3 results) No results for input(s): PROBNP in the last 8760 hours. HbA1C: Recent Labs    07/25/20 0450  HGBA1C 7.0*   CBG: Recent Labs  Lab 07/25/20 1140 07/25/20 1534 07/25/20 2033 07/26/20 0638 07/26/20 1132  GLUCAP 131* 131* 201* 171* 148*   Lipid Profile: No results for input(s): CHOL, HDL, LDLCALC, TRIG, CHOLHDL, LDLDIRECT in the last 72 hours. Thyroid Function Tests: No results for input(s): TSH, T4TOTAL, FREET4, T3FREE, THYROIDAB in the last 72 hours. Anemia Panel: Recent Labs    07/26/20 0252  FERRITIN 36  TIBC 413  IRON 109      Radiology Studies: I have reviewed all of the imaging during this hospital visit personally  Scheduled Meds: . buPROPion  150 mg Oral q morning - 10a  . docusate sodium  100 mg Oral BID  . DULoxetine  60 mg Oral Daily  . enoxaparin (LOVENOX) injection  40 mg Subcutaneous Q24H  . ferrous sulfate  325 mg Oral BID WC  . insulin aspart  0-5 Units Subcutaneous QHS  . insulin aspart  0-9 Units Subcutaneous TID WC  . lisinopril  5 mg Oral Daily  . nicotine  14 mg Transdermal Daily  . pantoprazole  40 mg Oral Daily  . rosuvastatin  10 mg Oral Daily  . senna  1 tablet Oral QPM  . zolpidem  5 mg Oral QHS   Continuous Infusions: . dextrose 5 % and 0.45% NaCl 75 mL/hr at 07/25/20 2238  . methocarbamol (ROBAXIN) IV       LOS: 1 day        Mishaal Lansdale Gerome Apley, MD

## 2020-07-26 NOTE — Progress Notes (Signed)
Subjective: 1 Day Post-Op Procedure(s) (LRB): RIGHT TOTAL HIP ARTHROPLASTY (Right) Patient reports pain as 5 on 0-10 scale.    Objective: Vital signs in last 24 hours: Temp:  [98 F (36.7 C)-99 F (37.2 C)] 98.7 F (37.1 C) (09/01 1457) Pulse Rate:  [91-102] 98 (09/01 1457) Resp:  [16-22] 18 (09/01 0745) BP: (119-146)/(58-94) 129/58 (09/01 1457) SpO2:  [91 %-100 %] 97 % (09/01 1457)  Intake/Output from previous day: 08/31 0701 - 09/01 0700 In: 3795 [I.V.:1930; Blood:1265; IV Piggyback:600] Out: 2680 [Urine:1330; Blood:1350] Intake/Output this shift: Total I/O In: 1115.3 [P.O.:480; I.V.:585.3; IV Piggyback:50] Out: -   Recent Labs    07/25/20 0450 07/25/20 1804 07/26/20 0252  HGB 9.6* 7.8* 8.9*   Recent Labs    07/25/20 0450 07/25/20 0450 07/25/20 1804 07/26/20 0252  WBC 14.9*  --   --  14.7*  RBC 5.72*  --   --  4.45  HCT 34.4*   < > 23.0* 29.6*  PLT 401*  --   --  279   < > = values in this interval not displayed.   Recent Labs    07/25/20 0450 07/25/20 0450 07/25/20 1804 07/26/20 0252  NA 131*   < > 135 130*  K 3.9   < > 4.3 4.8  CL 95*   < > 98 96*  CO2 21*  --   --  25  BUN 20   < > 21* 15  CREATININE 1.25*   < > 1.00 1.04*  GLUCOSE 162*   < > 130* 214*  CALCIUM 10.1  --   --  9.0   < > = values in this interval not displayed.   No results for input(s): LABPT, INR in the last 72 hours.  Neurologically intact ABD soft Neurovascular intact Sensation intact distally Incision: dressing C/D/I and no drainage   Assessment/Plan: 1 Day Post-Op Procedure(s) (LRB): RIGHT TOTAL HIP ARTHROPLASTY (Right) Advance diet Up with therapy   Anticipated LOS equal to or greater than 2 midnights due to - Age 55 and older with one or more of the following:  - Obesity  - Expected need for hospital services (PT, OT, Nursing) required for safe  discharge  - Anticipated need for postoperative skilled nursing care or inpatient rehab  - Active co-morbidities:  None OR   - Unanticipated findings during/Post Surgery: Lab abnormalities Hemoglobin and hematocrit low intraoperatively.2 units PRBC transfused intraoperatively.    Julie [name] 07/26/2020, 5:03 PM

## 2020-07-26 NOTE — Plan of Care (Signed)
°  Problem: Education: Goal: Verbalization of understanding the information provided (i.e., activity precautions, restrictions, etc) will improve Outcome: Progressing   Problem: Activity: Goal: Ability to ambulate and perform ADLs will improve Outcome: Progressing   Problem: Pain Management: Goal: Pain level will decrease Outcome: Progressing

## 2020-07-27 ENCOUNTER — Other Ambulatory Visit: Payer: Self-pay | Admitting: Physician Assistant

## 2020-07-27 DIAGNOSIS — S728X1A Other fracture of right femur, initial encounter for closed fracture: Secondary | ICD-10-CM

## 2020-07-27 LAB — CBC WITH DIFFERENTIAL/PLATELET
Abs Immature Granulocytes: 0.06 10*3/uL (ref 0.00–0.07)
Basophils Absolute: 0.1 10*3/uL (ref 0.0–0.1)
Basophils Relative: 1 %
Eosinophils Absolute: 0.6 10*3/uL — ABNORMAL HIGH (ref 0.0–0.5)
Eosinophils Relative: 4 %
HCT: 26.8 % — ABNORMAL LOW (ref 36.0–46.0)
Hemoglobin: 8 g/dL — ABNORMAL LOW (ref 12.0–15.0)
Immature Granulocytes: 1 %
Lymphocytes Relative: 27 %
Lymphs Abs: 3.6 10*3/uL (ref 0.7–4.0)
MCH: 20.1 pg — ABNORMAL LOW (ref 26.0–34.0)
MCHC: 29.9 g/dL — ABNORMAL LOW (ref 30.0–36.0)
MCV: 67.3 fL — ABNORMAL LOW (ref 80.0–100.0)
Monocytes Absolute: 1.6 10*3/uL — ABNORMAL HIGH (ref 0.1–1.0)
Monocytes Relative: 12 %
Neutro Abs: 7.5 10*3/uL (ref 1.7–7.7)
Neutrophils Relative %: 55 %
Platelets: 250 10*3/uL (ref 150–400)
RBC: 3.98 MIL/uL (ref 3.87–5.11)
RDW: 25.3 % — ABNORMAL HIGH (ref 11.5–15.5)
WBC: 13.3 10*3/uL — ABNORMAL HIGH (ref 4.0–10.5)
nRBC: 0.2 % (ref 0.0–0.2)

## 2020-07-27 LAB — GLUCOSE, CAPILLARY
Glucose-Capillary: 143 mg/dL — ABNORMAL HIGH (ref 70–99)
Glucose-Capillary: 168 mg/dL — ABNORMAL HIGH (ref 70–99)
Glucose-Capillary: 153 mg/dL — ABNORMAL HIGH (ref 70–99)
Glucose-Capillary: 171 mg/dL — ABNORMAL HIGH (ref 70–99)

## 2020-07-27 LAB — BASIC METABOLIC PANEL
Anion gap: 10 (ref 5–15)
BUN: 13 mg/dL (ref 6–20)
CO2: 26 mmol/L (ref 22–32)
Calcium: 9.2 mg/dL (ref 8.9–10.3)
Chloride: 97 mmol/L — ABNORMAL LOW (ref 98–111)
Creatinine, Ser: 0.99 mg/dL (ref 0.44–1.00)
GFR calc Af Amer: >60 mL/min (ref >60)
GFR calc non Af Amer: >60 mL/min (ref >60)
Glucose, Bld: 149 mg/dL — ABNORMAL HIGH (ref 70–99)
Potassium: 4 mmol/L (ref 3.5–5.1)
Sodium: 133 mmol/L — ABNORMAL LOW (ref 135–145)

## 2020-07-27 LAB — MAGNESIUM: Magnesium: 2 mg/dL (ref 1.7–2.4)

## 2020-07-27 LAB — PREPARE RBC (CROSSMATCH)

## 2020-07-27 MED ORDER — HYDROMORPHONE HCL 1 MG/ML IJ SOLN
1.0000 mg | INTRAMUSCULAR | Status: DC | PRN
  Administered 2020-07-28 (×2): 1 mg via INTRAVENOUS
  Filled 2020-07-27 (×3): qty 1

## 2020-07-27 MED ORDER — SODIUM CHLORIDE 0.9% IV SOLUTION: Freq: Once | INTRAVENOUS | Status: AC

## 2020-07-27 NOTE — Progress Notes (Signed)
PROGRESS NOTE    Julie [name]  CWC:[id number] DOB: 1961-05-10 DOA: 07/25/2020 PCP: Jenel Lucks, PA-C    Brief Narrative:  Patient was admitted to the hospital with a working diagnosis of acute right femoral neck fracture  59 year old female with past medical history for hypertension, type 2 diabetes mellitus, beta thalassemia and depression.  She sustained a mechanical fall while tripping and landing onto her right knee.  Post trauma she has severe local pain at the right knee, hip and groin.  She had significant decreased range of motion due to pain.  She was brought to the hospital for further evaluation.  On her initial physical examination blood pressure 155/90, heart rate 92, respirate 22, temperature 97.9, oxygen saturation 99%.  She had moist mucous membranes, lungs clear to auscultation bilaterally, heart S1-S2, present rhythmic, soft abdomen, no lower extremity edema.  Right foot was in a cast from prior procedure (right foot tendon repair 07/21).  Radiographic evaluation showed a right femoral neck fracture with varus angulation.  Patient was admitted to the medical ward, she received analgesics and DVT prophylaxis.  She was evaluated by orthopedics and underwent right total hip arthroplasty.   Assessment & Plan:   Principal Problem:   Status post total hip replacement, right Active Problems:   Closed displaced fracture of right femoral neck (HCC)   Fall   Thrombocytosis (HCC)   Depression   GERD (gastroesophageal reflux disease)   Type 2 diabetes mellitus with hyperlipidemia (HCC)   Hyponatremia   Leukocytosis   AKI (acute kidney injury) ([place])   Beta thalassemia ([place])   1. Acute right femoral neck fracture. Improved pain control, but continue to have limited mobility.  On acetaminophen, hydromorphone, oxycodone and methocarbamol.   Plan for patient to continue therapy at home with home health services.    2. HTN. Blood pressure has been stable 102  to 120, will hold on antihypertensive medications for now.   3. T2DM/ dyslipidemia. Fasting glucose 149. Continue glucose cover and monitoring with insulin sliding scale.   On rosuvastatin.   4. Depression. On bupropion and duloxetine.   5. Iron deficiency anemia (mic/ anemia of chronic diease/ post-operative anemia. Iron panel with serum iron at 109, TIBC 413, transferrin saturation 26 and ferritin 36.  Patient had 2 units PRBC transfused trans-operative.   Continue to drop Hgb down to 8,0 and Hct at 26.8. No ecchymosis at the surgical site, but positive intraoperative bleeding.   Plan for PRBC transfusion today per orthopedics, continue DVT prophylaxis with asa full dose.    6. Tobacco abuse. nicotine patch for smoking cessation    8. Hypomagnesemia/ hyponatremia. Renal function with serum cr at 0,99 with K at 4,0 and serum bicarbonate at 26. Mg corrected at 2,0.    Patient continue to be at high risk for worsening post operative anemia  Status is: Inpatient  Remains inpatient appropriate because:IV treatments appropriate due to intensity of illness or inability to take PO   Dispo: The patient is from: Home              Anticipated d/c is to: Home              Anticipated d/c date is: 1 day              Patient currently is not medically stable to d/c.  DVT prophylaxis: Asa   Code Status:    full  Family Communication:  I spoke with patient's daughter at the bedside, we talked  in detail about patient's condition, plan of care and prognosis and all questions were addressed.      Nutrition Status:           Skin Documentation:     Consultants:   Orthopedics   Procedures:   Right hip arthroplasty       Subjective: Patient continue to have pain at the surgical site, worse with movement, no nausea or vomiting, no dyspnea or chest pain.   Objective: Vitals:   07/26/20 0745 07/26/20 1457 07/26/20 1938 07/27/20 0311  BP: 121/60 (!) 129/58 121/77 (!)  102/52  Pulse: 92 98 90 62  Resp: 18  20 19   Temp: 98.8 F (37.1 C) 98.7 F (37.1 C) 99.3 F (37.4 C) 98.5 F (36.9 C)  TempSrc: Oral Oral Oral Oral  SpO2: 91% 97% 98% 92%  Weight:      Height:        Intake/Output Summary (Last 24 hours) at 07/27/2020 1127 Last data filed at 07/27/2020 0600 Gross per 24 hour  Intake 1235.31 ml  Output --  Net 1235.31 ml   Filed Weights   07/25/20 0355  Weight: 77.1 kg    Examination:   General: Not in pain or dyspnea . Deconditioned  Neurology: Awake and alert, non focal  E ENT: mild pallor, no icterus, oral mucosa moist Cardiovascular: No JVD. S1-S2 present, rhythmic, no gallops, rubs, or murmurs. No lower extremity edema. Pulmonary: positive breath sounds bilaterally, adequate air movement, no wheezing, rhonchi or rales. Gastrointestinal. Abdomen soft and non tender Skin. No rashes Musculoskeletal: no joint deformities     Data Reviewed: I have personally reviewed following labs and imaging studies  CBC: Recent Labs  Lab 07/25/20 0450 07/25/20 1804 07/26/20 0252 07/27/20 0554  WBC 14.9*  --  14.7* 13.3*  NEUTROABS  --   --   --  7.5  HGB 9.6* 7.8* 8.9* 8.0*  HCT 34.4* 23.0* 29.6* 26.8*  MCV 60.1*  --  66.5* 67.3*  PLT 401*  --  279 185   Basic Metabolic Panel: Recent Labs  Lab 07/25/20 0450 07/25/20 1804 07/26/20 0252 07/27/20 0554  NA 131* 135 130* 133*  K 3.9 4.3 4.8 4.0  CL 95* 98 96* 97*  CO2 21*  --  25 26  GLUCOSE 162* 130* 214* 149*  BUN 20 21* 15 13  CREATININE 1.25* 1.00 1.04* 0.99  CALCIUM 10.1  --  9.0 9.2  MG  --   --  1.6* 2.0   GFR: Estimated Creatinine Clearance: 64.9 mL/min (by C-G formula based on SCr of 0.99 mg/dL). Liver Function Tests: No results for input(s): AST, ALT, ALKPHOS, BILITOT, PROT, ALBUMIN in the last 168 hours. No results for input(s): LIPASE, AMYLASE in the last 168 hours. No results for input(s): AMMONIA in the last 168 hours. Coagulation Profile: No results for  input(s): INR, PROTIME in the last 168 hours. Cardiac Enzymes: Recent Labs  Lab 07/25/20 0828  CKTOTAL 132   BNP (last 3 results) No results for input(s): PROBNP in the last 8760 hours. HbA1C: Recent Labs    07/25/20 0450  HGBA1C 7.0*   CBG: Recent Labs  Lab 07/26/20 0638 07/26/20 1132 07/26/20 1620 07/26/20 2034 07/27/20 0636  GLUCAP 171* 148* 169* 116* 143*   Lipid Profile: No results for input(s): CHOL, HDL, LDLCALC, TRIG, CHOLHDL, LDLDIRECT in the last 72 hours. Thyroid Function Tests: No results for input(s): TSH, T4TOTAL, FREET4, T3FREE, THYROIDAB in the last 72 hours. Anemia Panel: Recent Labs  07/26/20 0252  FERRITIN 36  TIBC 413  IRON 109      Radiology Studies: I have reviewed all of the imaging during this hospital visit personally     Scheduled Meds: . aspirin  325 mg Oral Daily  . buPROPion  150 mg Oral q morning - 10a  . docusate sodium  100 mg Oral BID  . DULoxetine  60 mg Oral Daily  . ferrous sulfate  325 mg Oral BID WC  . insulin aspart  0-5 Units Subcutaneous QHS  . insulin aspart  0-9 Units Subcutaneous TID WC  . lisinopril  5 mg Oral Daily  . nicotine  14 mg Transdermal Daily  . pantoprazole  40 mg Oral Daily  . rosuvastatin  10 mg Oral Daily  . senna  1 tablet Oral QPM  . zolpidem  5 mg Oral QHS   Continuous Infusions: . methocarbamol (ROBAXIN) IV       LOS: 2 days        Shady Padron Gerome Apley, MD

## 2020-07-27 NOTE — Plan of Care (Signed)
  Problem: Education: Goal: Verbalization of understanding the information provided (i.e., activity precautions, restrictions, etc) will improve Outcome: Progressing Goal: Individualized Educational Video(s) Outcome: Progressing   Problem: Activity: Goal: Ability to ambulate and perform ADLs will improve Outcome: Progressing   

## 2020-07-27 NOTE — Progress Notes (Signed)
Subjective: 2 Days Post-Op Procedure(s) (LRB): RIGHT TOTAL HIP ARTHROPLASTY (Right) Patient reports pain as moderate.  Patient does complain of some dizziness and feeling weak when she gets up to walk.  She feels that this is limiting her rehabilitation.  Objective: Vital signs in last 24 hours: Temp:  [98.5 F (36.9 C)-99.3 F (37.4 C)] 98.5 F (36.9 C) (09/02 0311) Pulse Rate:  [62-98] 62 (09/02 0311) Resp:  [19-20] 19 (09/02 0311) BP: (102-129)/(52-77) 102/52 (09/02 0311) SpO2:  [92 %-98 %] 92 % (09/02 0311)  Intake/Output from previous day: 09/01 0701 - 09/02 0700 In: 1475.3 [P.O.:840; I.V.:585.3; IV Piggyback:50] Out: -  Intake/Output this shift: No intake/output data recorded.  Recent Labs    07/25/20 0450 07/25/20 1804 07/26/20 0252 07/27/20 0554  HGB 9.6* 7.8* 8.9* 8.0*   Recent Labs    07/26/20 0252 07/27/20 0554  WBC 14.7* 13.3*  RBC 4.45 3.98  HCT 29.6* 26.8*  PLT 279 250   Recent Labs    07/26/20 0252 07/27/20 0554  NA 130* 133*  K 4.8 4.0  CL 96* 97*  CO2 25 26  BUN 15 13  CREATININE 1.04* 0.99  GLUCOSE 214* 149*  CALCIUM 9.0 9.2   No results for input(s): LABPT, INR in the last 72 hours.  Neurologically intact ABD soft Neurovascular intact Sensation intact distally Intact pulses distally Dorsiflexion/Plantar flexion intact No cellulitis present Compartment soft minimal pain with range of motion right hip   Assessment/Plan: 2 Days Post-Op Procedure(s) (LRB): RIGHT TOTAL HIP ARTHROPLASTY (Right)  The patient continues to be somewhat symptomatic from low hemoglobin.  Given her history of thalassemia and inability to make red blood cells I think that transfusion is appropriate.  At this point I will transfuse her one additional unit of red blood cells.  I think this will allow her to be able to be discharged tomorrow and participate greater in physical therapy. Plan for discharge tomorrow      Alta Corning 07/27/2020, 12:50 PM

## 2020-07-27 NOTE — Progress Notes (Signed)
Physical Therapy Treatment Patient Details Name: Julie [name] MRN: [id number] DOB: 02-Apr-1961 Today's Date: 07/27/2020    History of Present Illness Pt is a 59 y.o. F with significant PMH of diabetes mellitus,and recent R soft tissue ankle surgery 3 weeks ago who presents after a fall with a right displaced femoral neck fracture. Now s/p right total hip arthroplasty 8/31.    PT Comments    Pt supine in bed on arrival this session.  Pt currently receiving a blood transfusion and not suitable for OOB mobility.. Focused on B LE strengthening and repositioned and improved fit of cam walker.  Pt continues to benefit from HHPT.  Plan for stair training next session.     Follow Up Recommendations  Home health PT;Supervision for mobility/OOB     Equipment Recommendations  None recommended by PT    Recommendations for Other Services       Precautions / Restrictions Precautions Precautions: Fall Required Braces or Orthoses: Other Brace Other Brace: R CAM Restrictions Weight Bearing Restrictions: Yes RLE Weight Bearing: Weight bearing as tolerated    Mobility  Bed Mobility               General bed mobility comments: deferred getting blood transfusion so deferred mobility as the IV site has been difficult.  Transfers                    Ambulation/Gait                 Stairs             Wheelchair Mobility    Modified Rankin (Stroke Patients Only)       Balance                                            Cognition Arousal/Alertness: Awake/alert Behavior During Therapy: WFL for tasks assessed/performed Overall Cognitive Status: Within Functional Limits for tasks assessed                                        Exercises General Exercises - Lower Extremity Ankle Circles/Pumps: AROM;Left;10 reps;Supine Quad Sets: AROM;Both;10 reps;Supine Short Arc Quad: AROM;Both;10 reps;Supine Heel Slides: AROM;Both;10  reps;AAROM;Supine Hip ABduction/ADduction: AROM;AAROM;Both;10 reps;Supine    General Comments        Pertinent Vitals/Pain Pain Assessment: Faces Faces Pain Scale: Hurts even more Pain Location: R hip Pain Descriptors / Indicators: Operative site guarding;Grimacing Pain Intervention(s): Monitored during session;Repositioned    Home Living                      Prior Function            PT Goals (current goals can now be found in the care plan section) Acute Rehab PT Goals Patient Stated Goal: improve mobility, not fall again Potential to Achieve Goals: Good Progress towards PT goals: Progressing toward goals    Frequency    Min 5X/week      PT Plan Current plan remains appropriate    Co-evaluation              AM-PAC PT "6 Clicks" Mobility   Outcome Measure  Help needed turning from your back to your side while in a flat bed without using bedrails?: None Help  needed moving from lying on your back to sitting on the side of a flat bed without using bedrails?: A Little Help needed moving to and from a bed to a chair (including a wheelchair)?: A Little Help needed standing up from a chair using your arms (e.g., wheelchair or bedside chair)?: A Little Help needed to walk in hospital room?: A Little Help needed climbing 3-5 steps with a railing? : A Lot 6 Click Score: 18    End of Session   Activity Tolerance: Treatment limited secondary to medical complications (Comment) (blood transfusion) Patient left: in bed;with call bell/phone within reach Nurse Communication: Mobility status PT Visit Diagnosis: Pain;Difficulty in walking, not elsewhere classified (R26.2);Other abnormalities of gait and mobility (R26.89) Pain - Right/Left: Right Pain - part of body: Hip     Time: 1607-3710 PT Time Calculation (min) (ACUTE ONLY): 29 min  Charges:  $Therapeutic Exercise: 23-37 mins                     Erasmo Leventhal , PTA Acute Rehabilitation Services Pager  [phone] Office [phone]     Bridney Guadarrama Eli Hose 07/27/2020, 6:45 PM

## 2020-07-27 NOTE — Anesthesia Postprocedure Evaluation (Signed)
Anesthesia Post Note  Patient: Julie [name]  Procedure(s) Performed: RIGHT TOTAL HIP ARTHROPLASTY (Right Hip)     Patient location during evaluation: PACU Anesthesia Type: General Level of consciousness: awake and alert Pain management: pain level controlled Vital Signs Assessment: post-procedure vital signs reviewed and stable Respiratory status: spontaneous breathing, nonlabored ventilation, respiratory function stable and patient connected to nasal cannula oxygen Cardiovascular status: blood pressure returned to baseline and stable Postop Assessment: no apparent nausea or vomiting Anesthetic complications: no   No complications documented.  Last Vitals:  Vitals:   07/26/20 1938 07/27/20 0311  BP: 121/77 (!) 102/52  Pulse: 90 62  Resp: 20 19  Temp: 37.4 C 36.9 C  SpO2: 98% 92%    Last Pain:  Vitals:   07/27/20 0705  TempSrc:   PainSc: 0-No pain                 Ayaan Ringle S

## 2020-07-27 NOTE — TOC Initial Note (Addendum)
Transition of Care [facility]) - Initial/Assessment Note    Patient Details  Name: Julie [name] MRN: [id number] Date of Birth: 02-26-1961  [facility]) CM/SW Contact:    Sharin Mons, RN Phone Number: [phone] 07/27/2020, 9:53 AM  Clinical Narrative:                 Pt presents from home s/p fall/ R hip pain. Suffered femoral neck fracture with displacement, right.    - s/p R THA, 8/31 Pt from home alone. PTA independent with ADL's . States already has DME: rolator , rolling walker, 3 in 1/ [facility].  Pt states once d/c she will go to mom's home ( [address] , [place], [place] ) to recover with assistance from mom and daughter . NCM shared PT 's eval./recommendation: Home health PT;Supervision for mobility/OOB. Pt agreeable to [facility] services. Choice provided. Pt without preference. Referral made with [facility]Marland Kitchenacceptance pending. F2F will be needed for HHPT services  For MD.  Pt states has transportation to home. Pt without Rx med concerns or affordability.  TOC team will continue to monitor for needs.....  9/3 [facility] accepted pt for [facility] services.  Expected Discharge Plan: [place] Barriers to Discharge: Continued Medical Work up   Patient Goals and CMS Choice     Choice offered to / list presented to : Patient  Expected Discharge Plan and Services Expected Discharge Plan: [place] Acute Care Choice: [place] arrangements for the past 2 months: Single Family Home                                      Prior Living Arrangements/Services Living arrangements for the past 2 months: Single Family Home                     Activities of Daily Living      Permission Sought/Granted                  Emotional Assessment              Admission diagnosis:  Femur fracture ([place]) [S72.90XA] Closed fracture of neck of right femur, initial encounter ([place]) [S72.001A] Closed displaced fracture of  right femoral neck ([place]) [S72.001A] Status post total hip replacement, right [S92.330] Patient Active Problem List   Diagnosis Date Noted  . Closed displaced fracture of right femoral neck ([place]) 07/25/2020  . Fall 07/25/2020  . Thrombocytosis ([place]) 07/25/2020  . Depression 07/25/2020  . GERD (gastroesophageal reflux disease) 07/25/2020  . Type 2 diabetes mellitus with hyperlipidemia ([place]) 07/25/2020  . Hyponatremia 07/25/2020  . Leukocytosis 07/25/2020  . AKI (acute kidney injury) ([place]) 07/25/2020  . Beta thalassemia ([place]) 07/25/2020  . Status post total hip replacement, right 07/25/2020   PCP:  Jenel Lucks, PA-C Pharmacy:   [facility] [place] - [id number] [place] [address] Phone: [phone] Fax: [phone]     Social Determinants of Health (SDOH) Interventions    Readmission Risk Interventions No flowsheet data found.

## 2020-07-27 NOTE — Plan of Care (Signed)

## 2020-07-28 LAB — CBC
HCT: 25.3 % — ABNORMAL LOW (ref 36.0–46.0)
Hemoglobin: 7.9 g/dL — ABNORMAL LOW (ref 12.0–15.0)
MCH: 21.2 pg — ABNORMAL LOW (ref 26.0–34.0)
MCHC: 31.2 g/dL (ref 30.0–36.0)
MCV: 67.8 fL — ABNORMAL LOW (ref 80.0–100.0)
Platelets: 210 10*3/uL (ref 150–400)
RBC: 3.73 MIL/uL — ABNORMAL LOW (ref 3.87–5.11)
RDW: 25.5 % — ABNORMAL HIGH (ref 11.5–15.5)
WBC: 10.8 10*3/uL — ABNORMAL HIGH (ref 4.0–10.5)
nRBC: 0.2 % (ref 0.0–0.2)

## 2020-07-28 LAB — TYPE AND SCREEN
ABO/RH(D): O POS
Antibody Screen: NEGATIVE
Unit division: 0
Unit division: 0
Unit division: 0

## 2020-07-28 LAB — BPAM RBC
Blood Product Expiration Date: 202110012359
Blood Product Expiration Date: 202110012359
Blood Product Expiration Date: 202110042359
ISSUE DATE / TIME: 202108311747
ISSUE DATE / TIME: 202108311747
ISSUE DATE / TIME: 202109021343
Unit Type and Rh: 5100
Unit Type and Rh: 5100
Unit Type and Rh: 5100

## 2020-07-28 LAB — GLUCOSE, CAPILLARY
Glucose-Capillary: 149 mg/dL — ABNORMAL HIGH (ref 70–99)
Glucose-Capillary: 161 mg/dL — ABNORMAL HIGH (ref 70–99)
Glucose-Capillary: 170 mg/dL — ABNORMAL HIGH (ref 70–99)
Glucose-Capillary: 136 mg/dL — ABNORMAL HIGH (ref 70–99)

## 2020-07-28 MED ORDER — METFORMIN HCL 500 MG PO TABS
500.0000 mg | ORAL_TABLET | Freq: Two times a day (BID) | ORAL | Status: DC
  Administered 2020-07-28 – 2020-07-29 (×3): 500 mg via ORAL
  Filled 2020-07-28 (×3): qty 1

## 2020-07-28 NOTE — Progress Notes (Signed)
Subjective: 3 Days Post-Op Procedure(s) (LRB): RIGHT TOTAL HIP ARTHROPLASTY (Right) Patient reports pain as moderate.  She has been able to complete bedridden physical therapy but still has difficulty with dizziness, likely secondary to her anemic state, with continues to hamper her physical therapy.  Objective: Vital signs in last 24 hours: Temp:  [97.7 F (36.5 C)-99 F (37.2 C)] 98.6 F (37 C) (09/03 0810) Pulse Rate:  [66-95] 87 (09/03 0810) Resp:  [17-18] 17 (09/03 0810) BP: (93-128)/(50-93) 114/65 (09/03 0810) SpO2:  [96 %-100 %] 96 % (09/03 0810)  Intake/Output from previous day: 09/02 0701 - 09/03 0700 In: 50 [I.V.:50] Out: -  Intake/Output this shift: Total I/O In: 360 [P.O.:360] Out: -   Recent Labs    07/25/20 1804 07/26/20 0252 07/27/20 0554 07/28/20 0316  HGB 7.8* 8.9* 8.0* 7.9*   Recent Labs    07/27/20 0554 07/28/20 0316  WBC 13.3* 10.8*  RBC 3.98 3.73*  HCT 26.8* 25.3*  PLT 250 210   Recent Labs    07/26/20 0252 07/27/20 0554  NA 130* 133*  K 4.8 4.0  CL 96* 97*  CO2 25 26  BUN 15 13  CREATININE 1.04* 0.99  GLUCOSE 214* 149*  CALCIUM 9.0 9.2   No results for input(s): LABPT, INR in the last 72 hours.  ABD soft Neurovascular intact Sensation intact distally Incision: dressing C/D/I No erythema or drainage noted around the Aquacel dressing.   Assessment/Plan: 3 Days Post-Op Procedure(s) (LRB): RIGHT TOTAL HIP ARTHROPLASTY (Right) Advance diet Up with therapy Discharge home with home health once her hemoglobin and hematocrit stabilizes.   Anticipated LOS equal to or greater than 2 midnights due to - Age 58 and older with one or more of the following:  - Obesity  - Expected need for hospital services (PT, OT, Nursing) required for safe  discharge  - Anticipated need for postoperative skilled nursing care or inpatient rehab  - Active co-morbidities: Anemia OR   - Unanticipated findings during/Post Surgery: Lab abnormalities -  Hgb 7.9 this morning.  - Patient is a high risk of re-admission due to: None  Dereck Leep 07/28/2020, 12:02 PM

## 2020-07-28 NOTE — Plan of Care (Signed)

## 2020-07-28 NOTE — Progress Notes (Addendum)
PROGRESS NOTE    Julie [name]  MIW:[id number] DOB: 1961-11-08 DOA: 07/25/2020 PCP: Jenel Lucks, PA-C    Brief Narrative:  Patient was admitted to the hospital with a working diagnosis ofacuteright femoralneckfracture. Complicated with post-operative anemia.   59 year old female with past medical history for hypertension, type 2 diabetes mellitus, beta thalassemia and depression. She sustained a mechanical fall while tripping and landing onto her right knee. Post trauma she had severe local pain at the right knee, hip and groin. She had significant decreased range of motion due to pain. She was brought to the hospital for further evaluation. On her initial physical examination blood pressure 155/90, heart rate 92, respiratory rate 22, temperature 97.9, oxygen saturation 99%. She had moist mucous membranes, lungs clear to auscultation bilaterally, heart S1-S2, present rhythmic, soft abdomen, no lower extremity edema. Right foot was in a cast from prior procedure (right foot tendon repair 07/21). Radiographic evaluation showed a right femoral neck fracture with varus angulation.  Patient was admitted to the medical ward, she received analgesics and DVT prophylaxis. She was evaluated by orthopedics and underwent right total hip arthroplasty.  She developed postoperative anemia, required 3 units PRBC transfusion during her hospitalization.    Assessment & Plan:   Principal Problem:   Status post total hip replacement, right Active Problems:   Closed displaced fracture of right femoral neck (HCC)   Fall   Thrombocytosis (HCC)   Depression   GERD (gastroesophageal reflux disease)   Type 2 diabetes mellitus with hyperlipidemia (HCC)   Hyponatremia   Leukocytosis   AKI (acute kidney injury) ([place])   Beta thalassemia ([place])   1. Acute right femoral neck fracture. Post operative pain has been controlled with analgesics, no nausea or vomiting.  Continue   acetaminophen, hydromorphone, oxycodone and methocarbamol.   She will continue home therapy at discharge.    2. HTN. Continue to hold on antihypertensive medications, her blood pressure this am is 144//65 mmHg.   3. T2DM/ dyslipidemia. Fasting glucose 149. Patient tolerating po well. Will resume metformin and will discontinue insulin sliding scale for now.  Continue with rosuvastatin.   4. Depression. Continue with bupropion and duloxetine.   5. Iron deficiency anemia (mic/ anemia of chronic diease/ post-operative anemia. Iron panel with serum iron at 109, TIBC 413, transferrin saturation 26 and ferritin 36.Patient had 3 units PRBC transfused trans-operative.  Surgical site with edema but no local ecchymosis. Hgb today after the #3 PRBC transfusion is 7,9.  Will continue close follow up on H&H, patient may need further transfusions. Will check bilirubins and haptoglobin. Smear with ovalocytes but no schistocytes.  Continue dvt prophylaxis with aspirin for now and oral iron supplementation.    6. Tobacco abuse. Continue with nicotine patch for smoking cessation    8. Hypomagnesemia/ hyponatremia. clinically resolved. Patient is tolerating po well.    Status is: Inpatient  Remains inpatient appropriate because:Inpatient level of care appropriate due to severity of illness   Dispo: The patient is from: Home              Anticipated d/c is to: Home              Anticipated d/c date is: 1 day              Patient currently is not medically stable to d/c.  DVT prophylaxis: asa  Code Status:    full  Family Communication:  No family at the bedside       Consultants:  Orthopedics.   Procedures:   Right hip arthroplasty         Subjective: Patient continue to be very weak and deconditioned, continue to have pain a the right hip region with decreased mobility. No nausea or vomiting, no dyspnea or chest pain.   Objective: Vitals:   07/27/20 1614 07/27/20  2019 07/28/20 0459 07/28/20 0810  BP: (!) 100/50 (!) 104/93 (!) 128/52 114/65  Pulse: 66 89 95 87  Resp: 18 17 17 17   Temp: 99 F (37.2 C) 98.4 F (36.9 C) 98 F (36.7 C) 98.6 F (37 C)  TempSrc: Oral Oral Oral Oral  SpO2: 100%  98% 96%  Weight:      Height:        Intake/Output Summary (Last 24 hours) at 07/28/2020 1222 Last data filed at 07/28/2020 0900 Gross per 24 hour  Intake 410 ml  Output --  Net 410 ml   Filed Weights   07/25/20 0355  Weight: 77.1 kg    Examination:   General: Not in pain or dyspnea, deconditioned  Neurology: Awake and alert, non focal  E ENT: mild pallor, mild icterus, oral mucosa moist Cardiovascular: No JVD. S1-S2 present, rhythmic, no gallops, rubs, or murmurs. Trace lower extremity edema. Pulmonary: positive breath sounds bilaterally, adequate air movement, no wheezing, rhonchi or rales. Gastrointestinal. Abdomen soft  Skin. No rashes Musculoskeletal: no joint deformities     Data Reviewed: I have personally reviewed following labs and imaging studies  CBC: Recent Labs  Lab 07/25/20 0450 07/25/20 1804 07/26/20 0252 07/27/20 0554 07/28/20 0316  WBC 14.9*  --  14.7* 13.3* 10.8*  NEUTROABS  --   --   --  7.5  --   HGB 9.6* 7.8* 8.9* 8.0* 7.9*  HCT 34.4* 23.0* 29.6* 26.8* 25.3*  MCV 60.1*  --  66.5* 67.3* 67.8*  PLT 401*  --  279 250 130   Basic Metabolic Panel: Recent Labs  Lab 07/25/20 0450 07/25/20 1804 07/26/20 0252 07/27/20 0554  NA 131* 135 130* 133*  K 3.9 4.3 4.8 4.0  CL 95* 98 96* 97*  CO2 21*  --  25 26  GLUCOSE 162* 130* 214* 149*  BUN 20 21* 15 13  CREATININE 1.25* 1.00 1.04* 0.99  CALCIUM 10.1  --  9.0 9.2  MG  --   --  1.6* 2.0   GFR: Estimated Creatinine Clearance: 64.9 mL/min (by C-G formula based on SCr of 0.99 mg/dL). Liver Function Tests: No results for input(s): AST, ALT, ALKPHOS, BILITOT, PROT, ALBUMIN in the last 168 hours. No results for input(s): LIPASE, AMYLASE in the last 168 hours. No  results for input(s): AMMONIA in the last 168 hours. Coagulation Profile: No results for input(s): INR, PROTIME in the last 168 hours. Cardiac Enzymes: Recent Labs  Lab 07/25/20 0828  CKTOTAL 132   BNP (last 3 results) No results for input(s): PROBNP in the last 8760 hours. HbA1C: No results for input(s): HGBA1C in the last 72 hours. CBG: Recent Labs  Lab 07/27/20 1212 07/27/20 1649 07/27/20 2053 07/28/20 0641 07/28/20 1146  GLUCAP 168* 153* 171* 149* 161*   Lipid Profile: No results for input(s): CHOL, HDL, LDLCALC, TRIG, CHOLHDL, LDLDIRECT in the last 72 hours. Thyroid Function Tests: No results for input(s): TSH, T4TOTAL, FREET4, T3FREE, THYROIDAB in the last 72 hours. Anemia Panel: Recent Labs    07/26/20 0252  FERRITIN 36  TIBC 413  IRON 109      Radiology Studies: I have reviewed all of  the imaging during this hospital visit personally     Scheduled Meds: . aspirin  325 mg Oral Daily  . buPROPion  150 mg Oral q morning - 10a  . docusate sodium  100 mg Oral BID  . DULoxetine  60 mg Oral Daily  . ferrous sulfate  325 mg Oral BID WC  . insulin aspart  0-5 Units Subcutaneous QHS  . insulin aspart  0-9 Units Subcutaneous TID WC  . nicotine  14 mg Transdermal Daily  . pantoprazole  40 mg Oral Daily  . rosuvastatin  10 mg Oral Daily  . senna  1 tablet Oral QPM  . zolpidem  5 mg Oral QHS   Continuous Infusions: . methocarbamol (ROBAXIN) IV       LOS: 3 days        Vyolet Sakuma Gerome Apley, MD

## 2020-07-28 NOTE — Progress Notes (Signed)
PT Cancellation Note  Patient Details Name: Coreen Shippee MRN: [id number] DOB: [birth year]   Cancelled Treatment:    Reason Eval/Treat Not Completed: Fatigue/lethargy limiting ability to participate. Upon PT arrival, the pt was very lethargic and the pt's daughter asked that the PT returns when the pt is less lethargic from medication.   Hardie Pulley, DPT   Acute Rehabilitation Department Pager #: [phone]   Otho Bellows 07/28/2020, 12:35 PM

## 2020-07-28 NOTE — Progress Notes (Signed)
Physical Therapy Treatment Patient Details Name: Julie [name] MRN: [id number] DOB: [birth year] Today's Date: 07/28/2020    History of Present Illness Pt is a 59 y.o. F with significant PMH of diabetes mellitus,and recent R soft tissue ankle surgery 3 weeks ago who presents after a fall with a right displaced femoral neck fracture. Now s/p right total hip arthroplasty 8/31.    PT Comments    The pt was able to demo good progress with therapy this afternoon. She was feeling more alert than earlier in the day, and was safely able to progress ambulation to 45 ft of hallway ambulation, as well as to complete stair training. The pt continues to have limitations in strength, endurance, stability, and power, but is safe at this time to return home with her daughter for assist while continuing to receive therapy in the home.    Follow Up Recommendations  Home health PT;Supervision for mobility/OOB     Equipment Recommendations  None recommended by PT    Recommendations for Other Services       Precautions / Restrictions Precautions Precautions: Fall Required Braces or Orthoses: Other Brace Other Brace: R CAM Restrictions Weight Bearing Restrictions: Yes RLE Weight Bearing: Weight bearing as tolerated    Mobility  Bed Mobility Overal bed mobility: Needs Assistance Bed Mobility: Supine to Sit     Supine to sit: Min assist     General bed mobility comments: minA to raise trunk from head of bed, VC for sequencing of movement. minA to RLE to leave and return to bed  Transfers Overall transfer level: Needs assistance Equipment used: Rolling walker (2 wheeled) Transfers: Sit to/from Stand Sit to Stand: Min assist         General transfer comment: MinA to boost to rise from elevated bed height, cues for hand placement and transition to walker  Ambulation/Gait Ambulation/Gait assistance: Min guard Gait Distance (Feet): 50 Feet (+15) Assistive device: Rolling walker (2  wheeled) Gait Pattern/deviations: Step-to pattern;Decreased stance time - right;Decreased weight shift to right;Antalgic Gait velocity: decreased Gait velocity interpretation: <1.31 ft/sec, indicative of household ambulator General Gait Details: Cues for sequencing, min guard for safety.   Stairs Stairs: Yes Stairs assistance: Min assist Stair Management: Forwards;Step to pattern;With walker Number of Stairs: 1 General stair comments: completed stair training with small curb step and use of RW, daughter and pt voiced understanding of technique, and the pt was able to complete without significant change in pain. At the pt's home she will use rails instead of RW, and both pt and her daughter expressed understanding of technique with rails       Balance Overall balance assessment: Needs assistance Sitting-balance support: Feet supported Sitting balance-Leahy Scale: Good     Standing balance support: Bilateral upper extremity supported Standing balance-Leahy Scale: Poor Standing balance comment: reliant on external support                            Cognition Arousal/Alertness: Awake/alert Behavior During Therapy: WFL for tasks assessed/performed Overall Cognitive Status: Impaired/Different from baseline Area of Impairment: Orientation                               General Comments: The pt was still generally WFL, but appeared to have moments where she was still mentally fuzzy or disoriented from the medications. the pt had a few instances of answering a question with an unrelated  statment or answer and was unaware of her mistakes. She continued to demo good safety awareness with mobility.         General Comments General comments (skin integrity, edema, etc.): pt and daughter given gait belt for safe stair navigation at home      Pertinent Vitals/Pain Pain Assessment: Faces Faces Pain Scale: Hurts little more Pain Location: R hip Pain Descriptors /  Indicators: Operative site guarding;Grimacing Pain Intervention(s): Limited activity within patient's tolerance;Monitored during session           PT Goals (current goals can now be found in the care plan section) Acute Rehab PT Goals Patient Stated Goal: improve mobility, not fall again PT Goal Formulation: With patient Time For Goal Achievement: 08/09/20 Potential to Achieve Goals: Good Progress towards PT goals: Progressing toward goals    Frequency    Min 5X/week      PT Plan Current plan remains appropriate       AM-PAC PT "6 Clicks" Mobility   Outcome Measure  Help needed turning from your back to your side while in a flat bed without using bedrails?: None Help needed moving from lying on your back to sitting on the side of a flat bed without using bedrails?: A Little Help needed moving to and from a bed to a chair (including a wheelchair)?: A Little Help needed standing up from a chair using your arms (e.g., wheelchair or bedside chair)?: A Little Help needed to walk in hospital room?: A Little Help needed climbing 3-5 steps with a railing? : A Lot 6 Click Score: 18    End of Session Equipment Utilized During Treatment: Gait belt Activity Tolerance: Patient tolerated treatment well Patient left: in bed;with call bell/phone within reach;with family/visitor present Nurse Communication: Mobility status PT Visit Diagnosis: Pain;Difficulty in walking, not elsewhere classified (R26.2);Other abnormalities of gait and mobility (R26.89) Pain - Right/Left: Right Pain - part of body: Hip     Time: 2395-3202 PT Time Calculation (min) (ACUTE ONLY): 32 min  Charges:  $Gait Training: 23-37 mins                     Karma Ganja, PT, DPT   Acute Rehabilitation Department Pager #: [phone]   Otho Bellows 07/28/2020, 4:02 PM

## 2020-07-29 ENCOUNTER — Other Ambulatory Visit: Payer: Self-pay

## 2020-07-29 ENCOUNTER — Encounter (HOSPITAL_COMMUNITY): Payer: Self-pay | Admitting: Orthopedic Surgery

## 2020-07-29 LAB — BASIC METABOLIC PANEL
Anion gap: 9 (ref 5–15)
BUN: 15 mg/dL (ref 6–20)
CO2: 24 mmol/L (ref 22–32)
Calcium: 9.1 mg/dL (ref 8.9–10.3)
Chloride: 98 mmol/L (ref 98–111)
Creatinine, Ser: 0.85 mg/dL (ref 0.44–1.00)
GFR calc Af Amer: >60 mL/min (ref >60)
GFR calc non Af Amer: >60 mL/min (ref >60)
Glucose, Bld: 134 mg/dL — ABNORMAL HIGH (ref 70–99)
Potassium: 4.1 mmol/L (ref 3.5–5.1)
Sodium: 131 mmol/L — ABNORMAL LOW (ref 135–145)

## 2020-07-29 LAB — COMPREHENSIVE METABOLIC PANEL
ALT: 29 U/L (ref 0–44)
AST: 35 U/L (ref 15–41)
Albumin: 3.2 g/dL — ABNORMAL LOW (ref 3.5–5.0)
Alkaline Phosphatase: 55 U/L (ref 38–126)
Anion gap: 8 (ref 5–15)
BUN: 13 mg/dL (ref 6–20)
CO2: 25 mmol/L (ref 22–32)
Calcium: 9 mg/dL (ref 8.9–10.3)
Chloride: 96 mmol/L — ABNORMAL LOW (ref 98–111)
Creatinine, Ser: 0.81 mg/dL (ref 0.44–1.00)
GFR calc Af Amer: >60 mL/min (ref >60)
GFR calc non Af Amer: >60 mL/min (ref >60)
Glucose, Bld: 141 mg/dL — ABNORMAL HIGH (ref 70–99)
Potassium: 4 mmol/L (ref 3.5–5.1)
Sodium: 129 mmol/L — ABNORMAL LOW (ref 135–145)
Total Bilirubin: 1.2 mg/dL (ref 0.3–1.2)
Total Protein: 6.1 g/dL — ABNORMAL LOW (ref 6.5–8.1)

## 2020-07-29 LAB — CBC WITH DIFFERENTIAL/PLATELET
Abs Immature Granulocytes: 0.04 10*3/uL (ref 0.00–0.07)
Basophils Absolute: 0.1 10*3/uL (ref 0.0–0.1)
Basophils Relative: 1 %
Eosinophils Absolute: 0.8 10*3/uL — ABNORMAL HIGH (ref 0.0–0.5)
Eosinophils Relative: 8 %
HCT: 25.9 % — ABNORMAL LOW (ref 36.0–46.0)
Hemoglobin: 8.1 g/dL — ABNORMAL LOW (ref 12.0–15.0)
Immature Granulocytes: 0 %
Lymphocytes Relative: 25 %
Lymphs Abs: 2.4 10*3/uL (ref 0.7–4.0)
MCH: 21 pg — ABNORMAL LOW (ref 26.0–34.0)
MCHC: 31.3 g/dL (ref 30.0–36.0)
MCV: 67.1 fL — ABNORMAL LOW (ref 80.0–100.0)
Monocytes Absolute: 0.9 10*3/uL (ref 0.1–1.0)
Monocytes Relative: 9 %
Neutro Abs: 5.5 10*3/uL (ref 1.7–7.7)
Neutrophils Relative %: 57 %
Platelets: 257 10*3/uL (ref 150–400)
RBC: 3.86 MIL/uL — ABNORMAL LOW (ref 3.87–5.11)
RDW: 25.7 % — ABNORMAL HIGH (ref 11.5–15.5)
WBC: 9.7 10*3/uL (ref 4.0–10.5)
nRBC: 0.2 % (ref 0.0–0.2)

## 2020-07-29 LAB — HAPTOGLOBIN: Haptoglobin: 219 mg/dL (ref 33–346)

## 2020-07-29 LAB — GLUCOSE, CAPILLARY
Glucose-Capillary: 167 mg/dL — ABNORMAL HIGH (ref 70–99)
Glucose-Capillary: 134 mg/dL — ABNORMAL HIGH (ref 70–99)
Glucose-Capillary: 112 mg/dL — ABNORMAL HIGH (ref 70–99)

## 2020-07-29 MED ORDER — POLYETHYLENE GLYCOL 3350 17 G PO PACK: 17.0000 g | PACK | Freq: Every day | ORAL | 0 refills | Status: AC | PRN

## 2020-07-29 MED ORDER — FERROUS SULFATE 325 (65 FE) MG PO TABS: 325.0000 mg | ORAL_TABLET | Freq: Two times a day (BID) | ORAL | 0 refills | Status: AC

## 2020-07-29 MED ORDER — METHOCARBAMOL 500 MG PO TABS: 500.0000 mg | ORAL_TABLET | Freq: Four times a day (QID) | ORAL | 0 refills | Status: AC | PRN

## 2020-07-29 MED ORDER — ACETAMINOPHEN 325 MG PO TABS: 650.0000 mg | ORAL_TABLET | Freq: Four times a day (QID) | ORAL | Status: AC | PRN

## 2020-07-29 MED ORDER — PANTOPRAZOLE SODIUM 40 MG PO TBEC: 40.0000 mg | DELAYED_RELEASE_TABLET | Freq: Every day | ORAL | 0 refills | Status: AC

## 2020-07-29 MED ORDER — NAPROXEN SODIUM 550 MG PO TABS: 550.0000 mg | ORAL_TABLET | Freq: Two times a day (BID) | ORAL | 0 refills | Status: AC

## 2020-07-29 MED ORDER — ACETAMINOPHEN 325 MG PO TABS
650.0000 mg | ORAL_TABLET | Freq: Four times a day (QID) | ORAL | Status: DC
  Administered 2020-07-29: 650 mg via ORAL
  Filled 2020-07-29 (×2): qty 2

## 2020-07-29 MED ORDER — ASPIRIN 325 MG PO TABS: 325.0000 mg | ORAL_TABLET | Freq: Every day | ORAL | 0 refills | Status: AC

## 2020-07-29 MED ORDER — NAPROXEN SODIUM 275 MG PO TABS
550.0000 mg | ORAL_TABLET | Freq: Two times a day (BID) | ORAL | Status: DC
  Administered 2020-07-29: 550 mg via ORAL
  Filled 2020-07-29 (×2): qty 2

## 2020-07-29 MED ORDER — SODIUM CHLORIDE 1 G PO TABS
2.0000 g | ORAL_TABLET | Freq: Once | ORAL | Status: AC
  Administered 2020-07-29: 2 g via ORAL
  Filled 2020-07-29: qty 2

## 2020-07-29 NOTE — Plan of Care (Signed)

## 2020-07-29 NOTE — Progress Notes (Signed)
Discharge instruction packet given to the patient.  Instruction reviewed with the patient and her daughter. Discharged to home with her daughter.

## 2020-07-29 NOTE — Progress Notes (Addendum)
     Julie [name] is a 59 y.o. female   Orthopaedic diagnosis: 5 weeks status post right tibialis anterior tendon reconstruction complicated by fall with right proximal femur fracture status post total hip arthroplasty by Dr. Berenice Primas  Subjective: Patient reports some pain in her thigh.  She denies ankle or foot pain.  She is wearing the walking boot.  She is done some ambulating on the hip.  Per patient's daughter the patient has been agitated and is confused.  She reports discussing stopping narcotic medicine which has not been stopped.  Objectyive: Vitals:   07/29/20 0405 07/29/20 0738  BP: (!) 144/75 125/75  Pulse: 89 74  Resp: 16 15  Temp: 98.1 F (36.7 C) 98.4 F (36.9 C)  SpO2: 99% 100%     Exam: Awake and alert Respirations even and unlabored No acute distress  Patient is agitated.  She inappropriately answers some questions.  Right hip dressing intact without shadowing.  Hip in a normal resting position.  Patient is able to actively move her toes.  Endorses intact sensation to her toes.  Assessment: Postop day four status post total hip arthroplasty on the right for femoral neck fracture 5 weeks status post tibialis anterior tendon reconstruction    Plan: We will discontinue narcotics for now as the patient is confused and agitated.  Pain control with anti-inflammatory and Tylenol.  Ordered scheduled Tylenol and naproxen. Patient's hemoglobin today is 8.1.  Sodium is 129. She has been able to ambulate some.  Denies shortness of breath or dizziness. Suitable for discharge once medically cleared by hospitalist team and physical therapy.    Radene Journey, MD

## 2020-07-29 NOTE — Discharge Summary (Signed)
Physician Discharge Summary  Kenishia Plack BOF:[id number] DOB: [birth year] DOA: 07/25/2020  PCP: Jenel Lucks, PA-C  Admit date: 07/25/2020 Discharge date: 07/29/2020  Admitted From: home  Disposition:  Home   Recommendations for Outpatient Follow-up and new medication changes:  1. Follow up with Jenel Lucks in 7 days.  2. Follow up cell count in 7 days.  3. Patient has been placed on full dose aspirin for DVT prophylaxis. 4. Follow up with renal panel in 7 days.    Home Health: yes   Equipment/Devices: na    Discharge Condition: stable  CODE STATUS: full  Diet recommendation: heart healthy   Brief/Interim Summary: Patient was admitted to the hospital with a working diagnosis ofacuteright femoralneckfracture. Complicated with post-operative anemia.   59 year old female with past medical history for hypertension, type 2 diabetes mellitus, beta thalassemia and depression. She sustained a mechanical fall while ambulating, landing onto her right knee. Post trauma she had severe local pain at the right knee, hip and groin region. She had significant decreased range of motion due to pain. She was brought to the hospital for further evaluation. On her initial physical examination blood pressure 155/90, heart rate 92, respiratory rate 22, temperature 97.9, oxygen saturation 99%. She had moist mucous membranes, lungs clear to auscultation bilaterally, heart S1-S2, present rhythmic, soft abdomen, no lower extremity edema. Right foot was in a cast from prior procedure (right foot tendon repair 07/21). Radiographic evaluation showed a right femoral neck fracture with varus angulation.  Sodium 131, potassium 3.9, chloride 95, bicarb 21, glucose 162, BUN 20, creatinine 1.25, white count 14.9, hemoglobin 9.6, hematocrit 34.4, platelets 401.  SARS COVID-19 negative.  Urinalysis negative for infection.  Chest radiograph no infiltrates.  EKG 86 bpm, normal axis, normal  intervals, sinus rhythm, no ST segment or T wave changes.  Patient was admitted to the medical ward, she received analgesics and DVT prophylaxis. She was evaluated by orthopedics and underwent right total hip arthroplasty.  She developed postoperative anemia, required 3 units PRBC transfusion during her hospitalization.  1.  Acute right femoral neck fracture.  Postoperative her pain was controlled with acetaminophen, hydromorphone, oxycodone and methocarbamol. She was evaluated by physical therapy, recommendations to continue recovery at home with home health services.  2.  Postoperative anemia, iron deficiency anemia combined with anemia of chronic disease/thalassemia..  Iron panel showed serum iron of 109, TIBC 413, transferrin saturation 26 and ferritin 36.  Patient required 3 units of packed red blood cells during her hospitalization.  Haptoglobin is pending, peripheral smear had ovalocytes but no schistocytes.  3.  Tobacco abuse.  Continue smoking cessation, nicotine patch.  4.  Hypomagnesemia/hyponatremia.  Electrolytes were corrected, her kidney function remained stable.  Her serum sodium at discharge  Discharge Diagnoses:  Principal Problem:   Status post total hip replacement, right Active Problems:   Closed displaced fracture of right femoral neck (HCC)   Fall   Thrombocytosis (HCC)   Depression   GERD (gastroesophageal reflux disease)   Type 2 diabetes mellitus with hyperlipidemia (HCC)   Hyponatremia   Leukocytosis   AKI (acute kidney injury) ([place])   Beta thalassemia ([place])    Discharge Instructions   Allergies as of 07/29/2020      Reactions   Penicillins Swelling   Facial swelling   Robitussin Cold+flu Daytime [dm-phenylephrine-acetaminophen]       Medication List    STOP taking these medications   ibuprofen 200 MG tablet Commonly known as: ADVIL   oxyCODONE 5  MG immediate release tablet Commonly known as: Oxy IR/ROXICODONE     TAKE these  medications   acetaminophen 325 MG tablet Commonly known as: TYLENOL Take 2 tablets (650 mg total) by mouth every 6 (six) hours as needed.   aspirin 325 MG tablet Take 1 tablet (325 mg total) by mouth daily. Start taking on: July 30, 2020   buPROPion 150 MG 24 hr tablet Commonly known as: WELLBUTRIN XL Take 150 mg by mouth every morning.   DULoxetine 60 MG capsule Commonly known as: CYMBALTA Take 60 mg by mouth daily.   ferrous sulfate 325 (65 FE) MG tablet Take 1 tablet (325 mg total) by mouth 2 (two) times daily with a meal.   lisinopril-hydrochlorothiazide 20-25 MG tablet Commonly known as: ZESTORETIC Take 1 tablet by mouth daily.   metFORMIN 500 MG tablet Commonly known as: GLUCOPHAGE Take 500 mg by mouth 2 (two) times daily.   methocarbamol 500 MG tablet Commonly known as: ROBAXIN Take 1 tablet (500 mg total) by mouth every 6 (six) hours as needed for muscle spasms.   naproxen sodium 550 MG tablet Commonly known as: ANAPROX Take 1 tablet (550 mg total) by mouth 2 (two) times daily with a meal.   omeprazole 20 MG capsule Commonly known as: PRILOSEC Take 20 mg by mouth daily.   pantoprazole 40 MG tablet Commonly known as: PROTONIX Take 1 tablet (40 mg total) by mouth daily for 15 days.   polyethylene glycol 17 g packet Commonly known as: MIRALAX / GLYCOLAX Take 17 g by mouth daily as needed for mild constipation.   potassium chloride 10 MEQ tablet Commonly known as: KLOR-CON Take 10 mEq by mouth daily.   rosuvastatin 10 MG tablet Commonly known as: CRESTOR Take 10 mg by mouth daily.   senna 8.6 MG Tabs tablet Commonly known as: SENOKOT Take 1 tablet by mouth every evening.   zolpidem 10 MG tablet Commonly known as: AMBIEN Take 10 mg by mouth at bedtime.       Follow-up Information    Home, [facility] Follow up.   Specialty: Home Health Services Why: home health services arranged Contact information: [address] [place]  [address] [phone]              Allergies  Allergen Reactions  . Penicillins Swelling    Facial swelling  . Robitussin Cold+Flu Daytime [Dm-Phenylephrine-Acetaminophen]     Consultations:  ORTHOPEDICS    Procedures/Studies: DG Chest 1 View  Result Date: 07/25/2020 CLINICAL DATA:  Fall. EXAM: CHEST  1 VIEW COMPARISON:  06/26/2016.  CT 08/30/2016. FINDINGS: Mediastinum and hilar structures normal. Heart size normal. Low lung volumes. No focal infiltrate. No pleural effusion or pneumothorax. No evidence of acute displaced rib fracture. Thoracic spine scoliosis. 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 IMPRESSION: 1. No acute cardiopulmonary disease. No evidence of displaced rib fracture or pneumothorax. 2. 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 Electronically Signed   By: Marcello Moores  Register   On: 07/25/2020 05:35   Pelvis Portable  Result Date: 07/25/2020 CLINICAL DATA:  Right hip arthroplasty EXAM: PORTABLE PELVIS 1-2 VIEWS COMPARISON:  07/25/2020 FINDINGS: Frontal view of the pelvis excludes the iliac crests by collimation. Right hip arthroplasty is identified in the expected position without signs of acute complication. Left hip is unremarkable. Visualized portions of the bony pelvis are normal. IMPRESSION: 1. Unremarkable right hip arthroplasty. Electronically Signed  By: Randa Ngo M.D.   On: 07/25/2020 19:27   DG Knee Complete 4 Views Right  Result Date: 07/25/2020 CLINICAL DATA:  Right femoral neck fracture. EXAM: RIGHT KNEE - COMPLETE 4+ VIEW COMPARISON:  None. FINDINGS: Bedding and atypical positioning due to ipsilateral fracture. No evidence of fracture, dislocation, or joint effusion. IMPRESSION: Negative. Electronically Signed   By: Monte Fantasia M.D.   On: 07/25/2020 05:33   DG C-Arm 1-60 Min  Result Date: 07/25/2020 CLINICAL DATA:  Right hip  arthroplasty EXAM: DG C-ARM 1-60 MIN; OPERATIVE RIGHT HIP WITH PELVIS FLUOROSCOPY TIME:  Fluoroscopy Time:  14 seconds Radiation Exposure Index (if provided by the fluoroscopic device): 1.25 mGy Number of Acquired Spot Images: 2 COMPARISON:  07/25/2020 FINDINGS: 2 fluoroscopic images are obtained during the performance of the procedure and are provided for interpretation only. The images demonstrate a right hip arthroplasty in the expected position without signs of acute complication. IMPRESSION: 1. Unremarkable right hip arthroplasty. Electronically Signed   By: Randa Ngo M.D.   On: 07/25/2020 19:26   DG HIP OPERATIVE UNILAT W OR W/O PELVIS RIGHT  Result Date: 07/25/2020 CLINICAL DATA:  Right hip arthroplasty EXAM: DG C-ARM 1-60 MIN; OPERATIVE RIGHT HIP WITH PELVIS FLUOROSCOPY TIME:  Fluoroscopy Time:  14 seconds Radiation Exposure Index (if provided by the fluoroscopic device): 1.25 mGy Number of Acquired Spot Images: 2 COMPARISON:  07/25/2020 FINDINGS: 2 fluoroscopic images are obtained during the performance of the procedure and are provided for interpretation only. The images demonstrate a right hip arthroplasty in the expected position without signs of acute complication. IMPRESSION: 1. Unremarkable right hip arthroplasty. Electronically Signed   By: Randa Ngo M.D.   On: 07/25/2020 19:26   DG Hip Unilat W or Wo Pelvis 2-3 Views Right  Result Date: 07/25/2020 CLINICAL DATA:  Fall at home EXAM: DG HIP (WITH OR WITHOUT PELVIS) 2-3V RIGHT COMPARISON:  None. FINDINGS: Right femoral neck fracture with varus angulation. Limited rotated assessment of the pelvis without detected fracture. No significant acetabular spurring. IMPRESSION: Right femoral neck fracture with varus angulation. Electronically Signed   By: Monte Fantasia M.D.   On: 07/25/2020 05:32   DG Femur Min 2 Views Right  Result Date: 07/25/2020 CLINICAL DATA:  Fall with hip pain EXAM: RIGHT FEMUR 2 VIEWS COMPARISON:  None. FINDINGS:  Right femoral neck fracture with varus angulation. The mid and distal femur are intact and located. No significant acetabular spurring. IMPRESSION: Right femoral neck fracture with varus angulation. Electronically Signed   By: Monte Fantasia M.D.   On: 07/25/2020 05:32      Procedures:  right total hip arthroplasty.   Subjective: Patient is out of bed to chair, pain has improved at the surgical site, no nausea or vomiting, no dyspnea.   Discharge Exam: Vitals:   07/29/20 0405 07/29/20 0738  BP: (!) 144/75 125/75  Pulse: 89 74  Resp: 16 15  Temp: 98.1 F (36.7 C) 98.4 F (36.9 C)  SpO2: 99% 100%   Vitals:   07/28/20 1709 07/28/20 1952 07/29/20 0405 07/29/20 0738  BP: 117/66 (!) 141/71 (!) 144/75 125/75  Pulse: 70 87 89 74  Resp: 16 16 16 15   Temp: 98.5 F (36.9 C) 98.4 F (36.9 C) 98.1 F (36.7 C) 98.4 F (36.9 C)  TempSrc: Oral Oral Oral Oral  SpO2: 100% 98% 99% 100%  Weight:      Height:        General: Not in pain or dyspnea.  Neurology:  Awake and alert, non focal  E ENT: mild pallor, mild icterus, oral mucosa moist Cardiovascular: No JVD. S1-S2 present, rhythmic, no gallops, rubs, or murmurs. No lower extremity edema. Pulmonary: positive breath sounds bilaterally, adequate air movement, no wheezing, rhonchi or rales. Gastrointestinal. Abdomen soft and non tender Skin. No rashes/ no ecchymosis at the right groin.  Musculoskeletal: no joint deformities   The results of significant diagnostics from this hospitalization (including imaging, microbiology, ancillary and laboratory) are listed below for reference.     Microbiology: Recent Results (from the past 240 hour(s))  SARS Coronavirus 2 by RT PCR (hospital order, performed in [facility] hospital lab) Nasopharyngeal Nasopharyngeal Swab     Status: None   Collection Time: 07/25/20  5:39 AM   Specimen: Nasopharyngeal Swab  Result Value Ref Range Status   SARS Coronavirus 2 NEGATIVE NEGATIVE Final    Comment:  (NOTE) SARS-CoV-2 target nucleic acids are NOT DETECTED.  The SARS-CoV-2 RNA is generally detectable in upper and lower respiratory specimens during the acute phase of infection. The lowest concentration of SARS-CoV-2 viral copies this assay can detect is 250 copies / mL. A negative result does not preclude SARS-CoV-2 infection and should not be used as the sole basis for treatment or other patient management decisions.  A negative result may occur with improper specimen collection / handling, submission of specimen other than nasopharyngeal swab, presence of viral mutation(s) within the areas targeted by this assay, and inadequate number of viral copies (<250 copies / mL). A negative result must be combined with clinical observations, patient history, and epidemiological information.  Fact Sheet for Patients:   StrictlyIdeas.no  Fact Sheet for Healthcare Providers: BankingDealers.co.za  This test is not yet approved or  cleared by the [place] FDA and has been authorized for detection and/or diagnosis of SARS-CoV-2 by FDA under an Emergency Use Authorization (EUA).  This EUA will remain in effect (meaning this test can be used) for the duration of the COVID-19 declaration under Section 564(b)(1) of the Act, 21 U.S.C. section 360bbb-3(b)(1), unless the authorization is terminated or revoked sooner.  Performed at [facility], [place] [address] [place], [place] [address]      Labs: BNP (last 3 results) No results for input(s): BNP in the last 8760 hours. Basic Metabolic Panel: Recent Labs  Lab 07/25/20 0450 07/25/20 1804 07/26/20 0252 07/27/20 0554  NA 131* 135 130* 133*  K 3.9 4.3 4.8 4.0  CL 95* 98 96* 97*  CO2 21*  --  25 26  GLUCOSE 162* 130* 214* 149*  BUN 20 21* 15 13  CREATININE 1.25* 1.00 1.04* 0.99  CALCIUM 10.1  --  9.0 9.2  MG  --   --  1.6* 2.0   Liver Function Tests: No results for input(s): AST,  ALT, ALKPHOS, BILITOT, PROT, ALBUMIN in the last 168 hours. No results for input(s): LIPASE, AMYLASE in the last 168 hours. No results for input(s): AMMONIA in the last 168 hours. CBC: Recent Labs  Lab 07/25/20 0450 07/25/20 1804 07/26/20 0252 07/27/20 0554 07/28/20 0316  WBC 14.9*  --  14.7* 13.3* 10.8*  NEUTROABS  --   --   --  7.5  --   HGB 9.6* 7.8* 8.9* 8.0* 7.9*  HCT 34.4* 23.0* 29.6* 26.8* 25.3*  MCV 60.1*  --  66.5* 67.3* 67.8*  PLT 401*  --  279 250 210   Cardiac Enzymes: Recent Labs  Lab 07/25/20 0828  CKTOTAL 132   BNP: Invalid  input(s): POCBNP CBG: Recent Labs  Lab 07/28/20 0641 07/28/20 1146 07/28/20 1622 07/28/20 2047 07/29/20 0639  GLUCAP 149* 161* 170* 136* 167*   D-Dimer No results for input(s): DDIMER in the last 72 hours. Hgb A1c No results for input(s): HGBA1C in the last 72 hours. Lipid Profile No results for input(s): CHOL, HDL, LDLCALC, TRIG, CHOLHDL, LDLDIRECT in the last 72 hours. Thyroid function studies No results for input(s): TSH, T4TOTAL, T3FREE, THYROIDAB in the last 72 hours.  Invalid input(s): FREET3 Anemia work up No results for input(s): VITAMINB12, FOLATE, FERRITIN, TIBC, IRON, RETICCTPCT in the last 72 hours. Urinalysis    Component Value Date/Time   COLORURINE YELLOW 07/25/2020 0950   APPEARANCEUR CLEAR 07/25/2020 0950   LABSPEC 1.023 07/25/2020 0950   PHURINE 5.0 07/25/2020 0950   GLUCOSEU NEGATIVE 07/25/2020 0950   HGBUR NEGATIVE 07/25/2020 0950   BILIRUBINUR NEGATIVE 07/25/2020 0950   KETONESUR NEGATIVE 07/25/2020 0950   PROTEINUR NEGATIVE 07/25/2020 0950   NITRITE NEGATIVE 07/25/2020 0950   LEUKOCYTESUR NEGATIVE 07/25/2020 0950   Sepsis Labs Invalid input(s): PROCALCITONIN,  WBC,  LACTICIDVEN Microbiology Recent Results (from the past 240 hour(s))  SARS Coronavirus 2 by RT PCR (hospital order, performed in [place] hospital lab) Nasopharyngeal Nasopharyngeal Swab     Status: None   Collection Time:  07/25/20  5:39 AM   Specimen: Nasopharyngeal Swab  Result Value Ref Range Status   SARS Coronavirus 2 NEGATIVE NEGATIVE Final    Comment: (NOTE) SARS-CoV-2 target nucleic acids are NOT DETECTED.  The SARS-CoV-2 RNA is generally detectable in upper and lower respiratory specimens during the acute phase of infection. The lowest concentration of SARS-CoV-2 viral copies this assay can detect is 250 copies / mL. A negative result does not preclude SARS-CoV-2 infection and should not be used as the sole basis for treatment or other patient management decisions.  A negative result may occur with improper specimen collection / handling, submission of specimen other than nasopharyngeal swab, presence of viral mutation(s) within the areas targeted by this assay, and inadequate number of viral copies (<250 copies / mL). A negative result must be combined with clinical observations, patient history, and epidemiological information.  Fact Sheet for Patients:   StrictlyIdeas.no  Fact Sheet for Healthcare Providers: BankingDealers.co.za  This test is not yet approved or  cleared by the [place] FDA and has been authorized for detection and/or diagnosis of SARS-CoV-2 by FDA under an Emergency Use Authorization (EUA).  This EUA will remain in effect (meaning this test can be used) for the duration of the COVID-19 declaration under Section 564(b)(1) of the Act, 21 U.S.C. section 360bbb-3(b)(1), unless the authorization is terminated or revoked sooner.  Performed at [facility], [place] [address], [place], [place] [address]      Time coordinating discharge: 45 minutes  SIGNED:   Tawni Millers, MD  [facility] 07/29/2020, 8:44 AM

## 2020-07-29 NOTE — Progress Notes (Signed)
Physical Therapy Treatment Patient Details Name: Julie [name] MRN: [id number] DOB: 1960/12/13 Today's Date: 07/29/2020    History of Present Illness Pt is a 59 y.o. F with significant PMH of diabetes mellitus,and recent R soft tissue ankle surgery 3 weeks ago who presents after a fall with a right displaced femoral neck fracture. Now s/p right total hip arthroplasty 8/31.    PT Comments    Pt presenting with delayed processing, per RN most likely from pain meds. Pt functioning at min guard level with RW. Encouraged pt to use shoe on L LE to even out leg length. Pt with good home set up and 24/7 assist. Pain at 4/10 in R thigh, appears under control. Pt with improved ambulation tolerance with RW however kept veering to the L despite max verbal cues. Acute PT to cont to follow.   Follow Up Recommendations  Home health PT;Supervision/Assistance - 24 hour     Equipment Recommendations   (pt requesting quad cane, can defer to HHPT)    Recommendations for Other Services       Precautions / Restrictions Precautions Precautions: Fall Required Braces or Orthoses: Other Brace Other Brace: R CAM Restrictions Weight Bearing Restrictions: Yes RLE Weight Bearing: Weight bearing as tolerated    Mobility  Bed Mobility               General bed mobility comments: pt up in chair upon PT arrival  Transfers Overall transfer level: Needs assistance Equipment used: Rolling walker (2 wheeled) Transfers: Sit to/from Stand Sit to Stand: Min guard         General transfer comment: verbal cues for hand placement  Ambulation/Gait Ambulation/Gait assistance: Min guard Gait Distance (Feet): 100 Feet Assistive device: Rolling walker (2 wheeled) Gait Pattern/deviations: Step-through pattern;Decreased stride length Gait velocity: decreased Gait velocity interpretation: <1.31 ft/sec, indicative of household ambulator General Gait Details: initially step to then progressed to step through,  verbal cues for walker safety, pt kept vearing to the L. Instructed pt to wear shoe on L LE to equal leg length as it became difficulty to clear R foot with CAM boot with onset of fatigue   Stairs             Wheelchair Mobility    Modified Rankin (Stroke Patients Only)       Balance Overall balance assessment: Needs assistance Sitting-balance support: Feet supported Sitting balance-Leahy Scale: Good     Standing balance support: Bilateral upper extremity supported Standing balance-Leahy Scale: Poor Standing balance comment: reliant on external support                            Cognition Arousal/Alertness: Awake/alert Behavior During Therapy: Flat affect Overall Cognitive Status: Impaired/Different from baseline Area of Impairment: Orientation;Problem solving;Memory                 Orientation Level: Disoriented to;Time;Situation   Memory: Decreased short-term memory       Problem Solving: Slow processing;Requires verbal cues;Requires tactile cues General Comments: pt stating 9/1 and thursday, doesn't recall fall, delayed response time to questions would pause and look up to ceiling       Exercises Total Joint Exercises Long Arc Quad: AROM;Right;10 reps;Seated (with 5 sec hold) General Exercises - Lower Extremity Gluteal Sets: AROM;Both;10 reps;Seated Hip Flexion/Marching: AROM;Right;10 reps;Seated    General Comments General comments (skin integrity, edema, etc.): re-fit CAM boot to optimize support, placed missing strap back on boot  Pertinent Vitals/Pain Pain Assessment: Faces Faces Pain Scale: Hurts little more Pain Location: R thigh muscle and knee Pain Descriptors / Indicators: Constant;Cramping Pain Intervention(s): Monitored during session    Home Living                      Prior Function            PT Goals (current goals can now be found in the care plan section) Progress towards PT goals: Progressing  toward goals    Frequency    Min 5X/week      PT Plan Current plan remains appropriate    Co-evaluation              AM-PAC PT "6 Clicks" Mobility   Outcome Measure  Help needed turning from your back to your side while in a flat bed without using bedrails?: None Help needed moving from lying on your back to sitting on the side of a flat bed without using bedrails?: A Little Help needed moving to and from a bed to a chair (including a wheelchair)?: A Little Help needed standing up from a chair using your arms (e.g., wheelchair or bedside chair)?: A Little Help needed to walk in hospital room?: A Little Help needed climbing 3-5 steps with a railing? : A Lot 6 Click Score: 18    End of Session Equipment Utilized During Treatment: Gait belt Activity Tolerance: Patient tolerated treatment well Patient left: in chair;with call bell/phone within reach Nurse Communication: Mobility status PT Visit Diagnosis: Pain;Difficulty in walking, not elsewhere classified (R26.2);Other abnormalities of gait and mobility (R26.89) Pain - Right/Left: Right Pain - part of body: Hip     Time: 0755-0819 PT Time Calculation (min) (ACUTE ONLY): 24 min  Charges:  $Gait Training: 8-22 mins $Therapeutic Exercise: 8-22 mins                     Kittie Plater, PT, DPT Acute Rehabilitation Services Pager #: [phone] Office #: [phone]    Berline Lopes 07/29/2020, 9:17 AM

## 2021-12-13 IMAGING — CR DG KNEE COMPLETE 4+V*R*
4 series · 4 of 4 positions shown · non-contrast
Comparison: None.

CLINICAL DATA: Right femoral neck fracture.

EXAM:
RIGHT KNEE - COMPLETE 4+ VIEW

[knee lat]
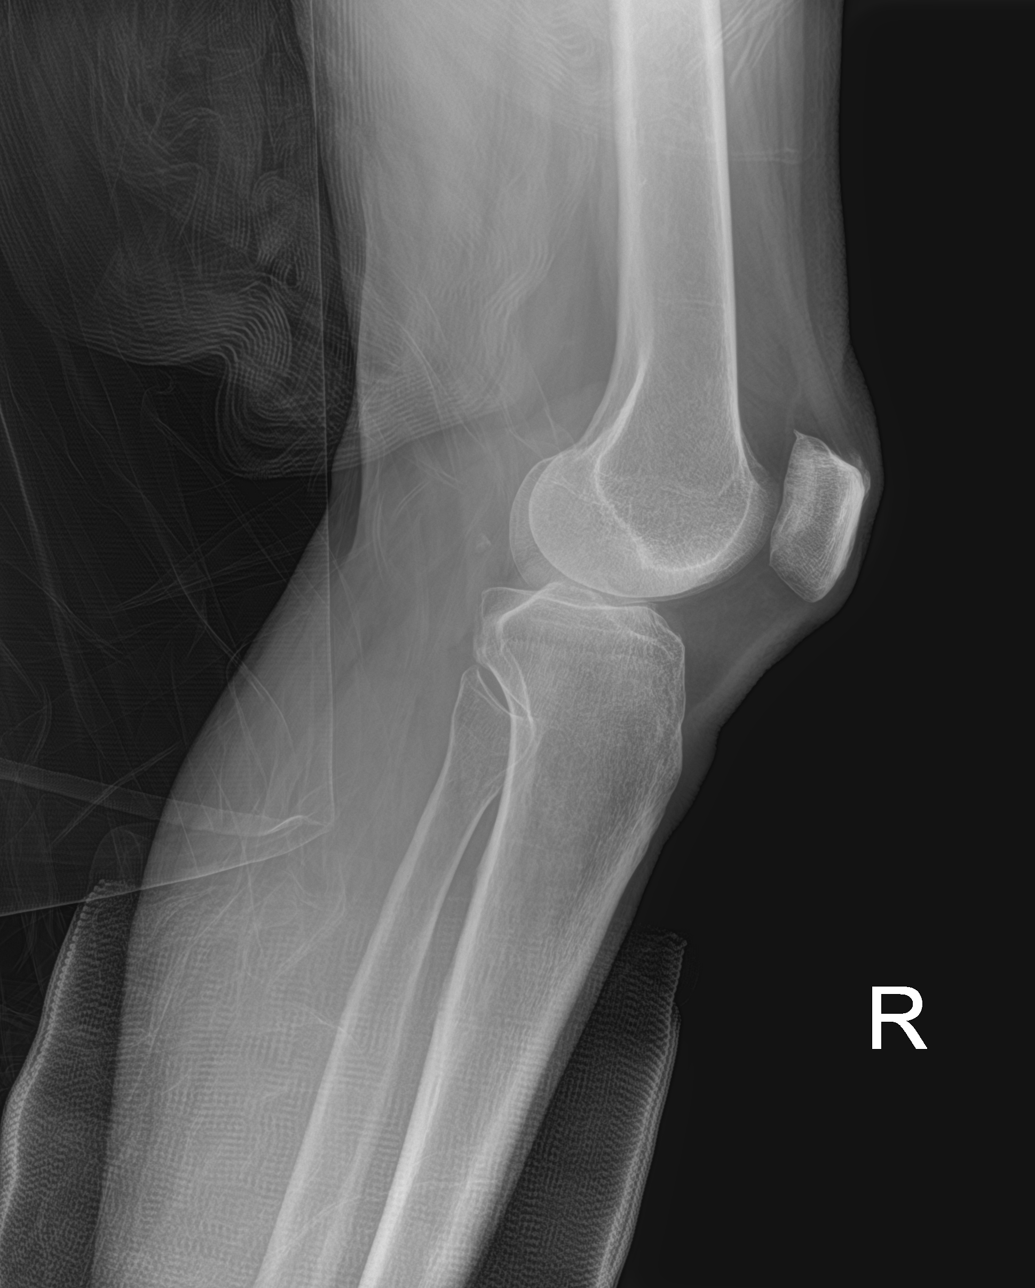

[knee obl (1 of 2)]
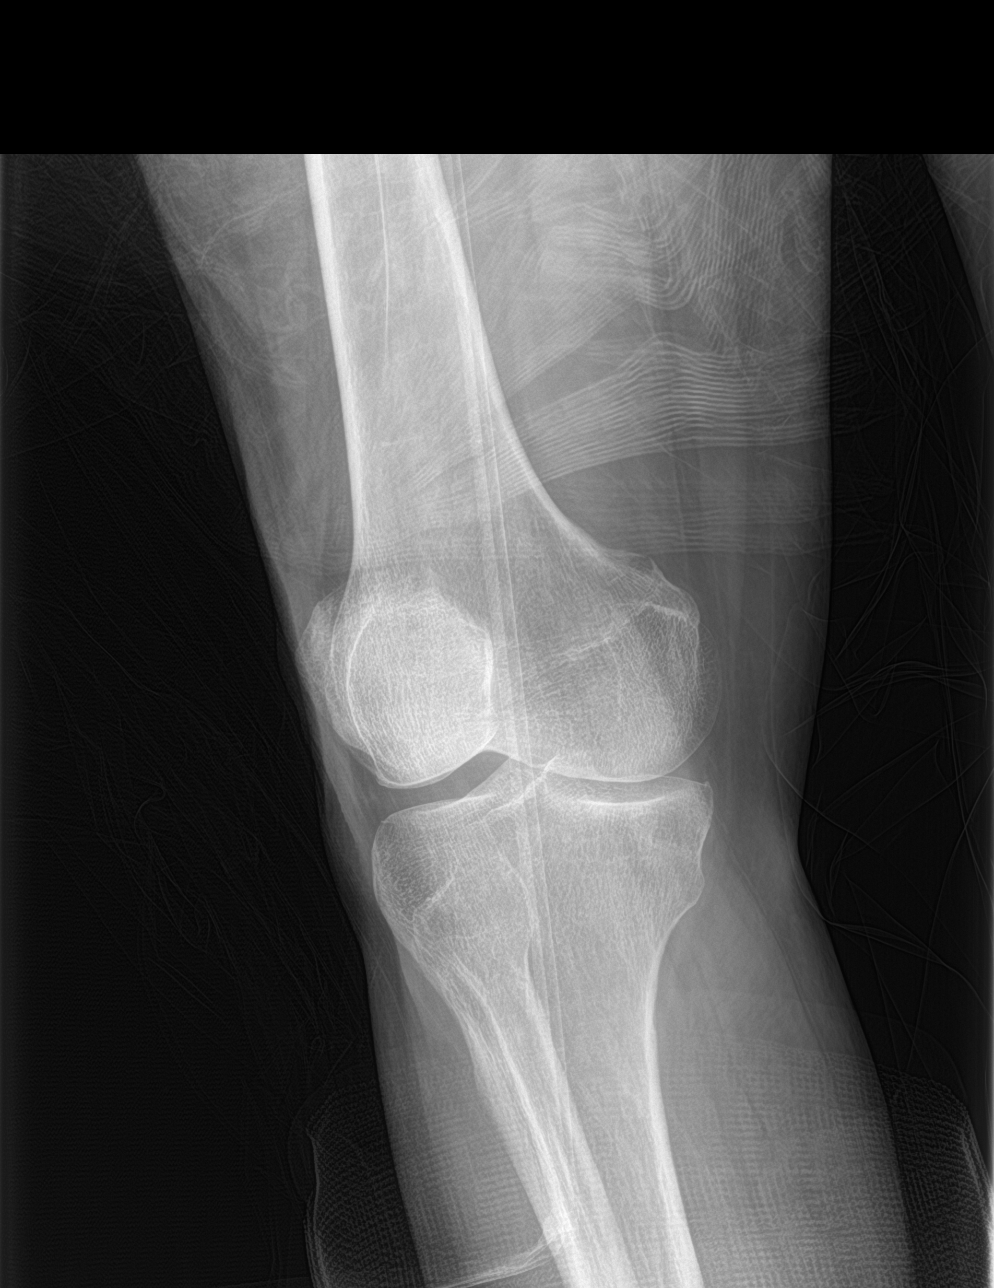

[knee ap]
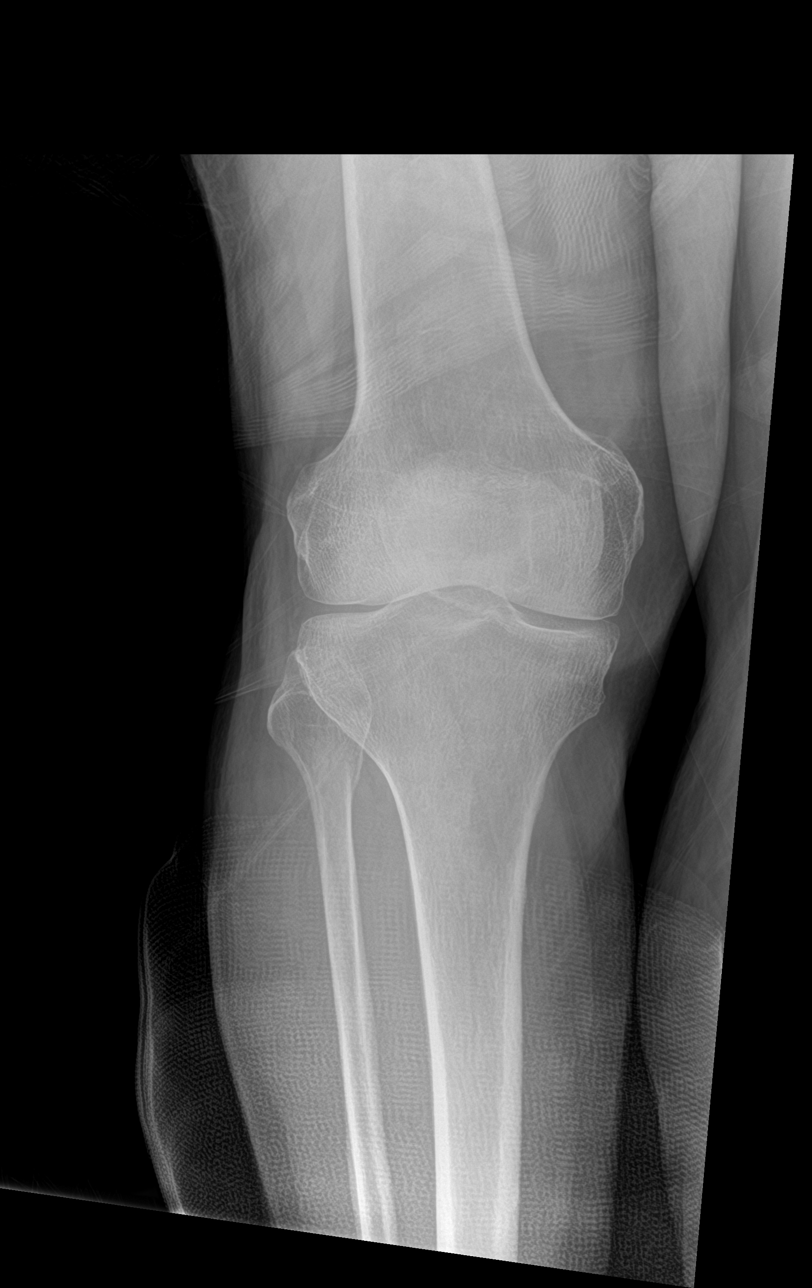

[knee obl (2 of 2)]
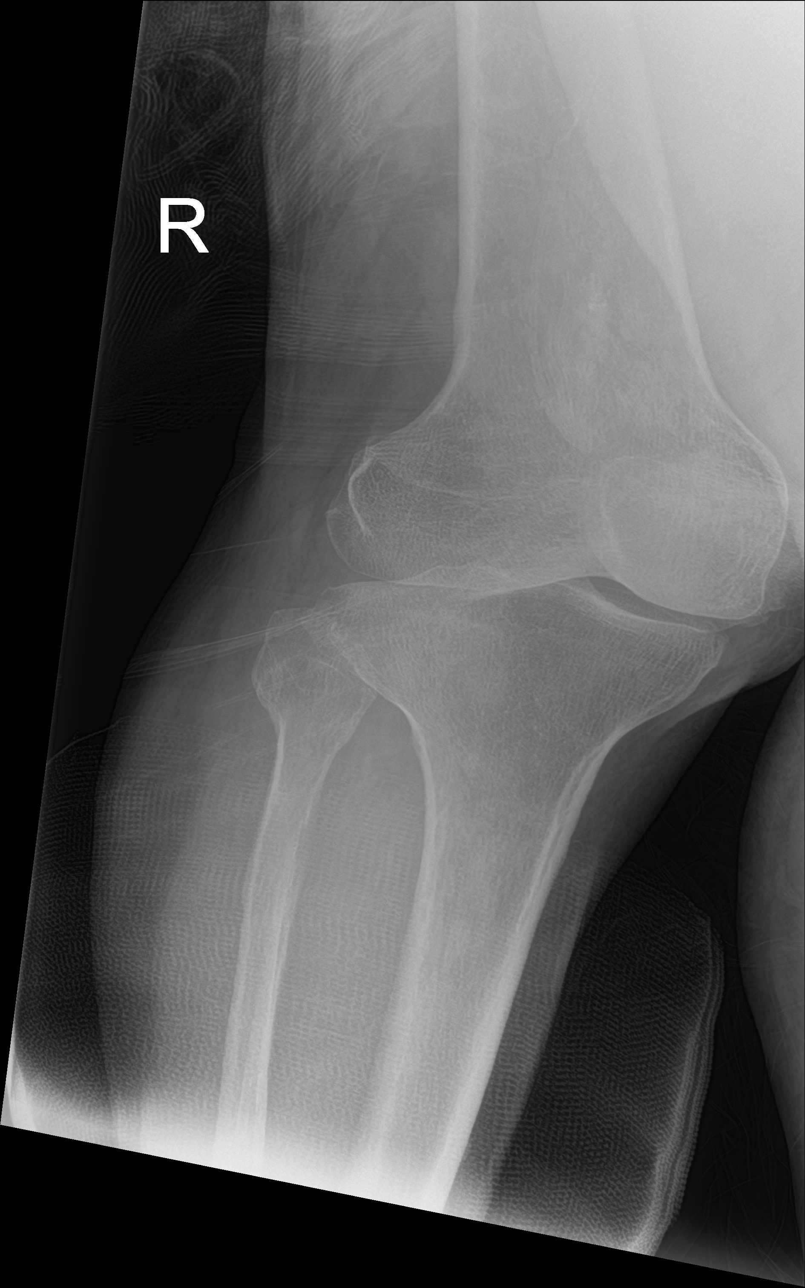

[4 of 4 positions shown; findings below may reference images not displayed]

FINDINGS: Bedding and atypical positioning due to ipsilateral fracture. No
evidence of fracture, dislocation, or joint effusion.
IMPRESSION: Negative.
